# Patient Record
Sex: Female | Born: 1975 | Hispanic: No | Marital: Married | Smoking: Never smoker
Health system: Southern US, Community
[De-identification: ages and names within clinical notes are randomized; demographics above are authoritative.]

## PROBLEM LIST (undated history)

## (undated) DIAGNOSIS — D259 Leiomyoma of uterus, unspecified: Secondary | ICD-10-CM

## (undated) DIAGNOSIS — R55 Syncope and collapse: Principal | ICD-10-CM

## (undated) DIAGNOSIS — D573 Sickle-cell trait: Secondary | ICD-10-CM

## (undated) HISTORY — PX: WISDOM TOOTH EXTRACTION: SHX21

## (undated) HISTORY — DX: Sickle-cell trait: D57.3

## (undated) HISTORY — DX: Syncope and collapse: R55

## (undated) HISTORY — DX: Leiomyoma of uterus, unspecified: D25.9

---

## 2006-11-27 ENCOUNTER — Encounter: Admission: RE | Admit: 2006-11-27 | Discharge: 2006-11-27 | Payer: Self-pay | Admitting: Internal Medicine

## 2009-05-24 ENCOUNTER — Ambulatory Visit (HOSPITAL_COMMUNITY): Admission: RE | Admit: 2009-05-24 | Discharge: 2009-05-24 | Payer: Self-pay | Admitting: Obstetrics and Gynecology

## 2009-10-02 ENCOUNTER — Inpatient Hospital Stay (HOSPITAL_COMMUNITY): Admission: AD | Admit: 2009-10-02 | Discharge: 2009-10-02 | Payer: Self-pay | Admitting: Obstetrics and Gynecology

## 2009-10-03 ENCOUNTER — Inpatient Hospital Stay (HOSPITAL_COMMUNITY): Admission: AD | Admit: 2009-10-03 | Discharge: 2009-10-05 | Payer: Self-pay | Admitting: Obstetrics and Gynecology

## 2009-10-03 ENCOUNTER — Encounter (INDEPENDENT_AMBULATORY_CARE_PROVIDER_SITE_OTHER): Payer: Self-pay | Admitting: Obstetrics and Gynecology

## 2010-02-17 ENCOUNTER — Encounter: Payer: Self-pay | Admitting: Obstetrics and Gynecology

## 2010-04-11 LAB — CBC
HCT: 38.5 % (ref 36.0–46.0)
Hemoglobin: 12.6 g/dL (ref 12.0–15.0)
MCH: 28 pg (ref 26.0–34.0)
MCHC: 32.9 g/dL (ref 30.0–36.0)
MCHC: 34 g/dL (ref 30.0–36.0)
MCV: 85.1 fL (ref 78.0–100.0)
Platelets: 127 10*3/uL — ABNORMAL LOW (ref 150–400)
RDW: 16.6 % — ABNORMAL HIGH (ref 11.5–15.5)
WBC: 14.5 10*3/uL — ABNORMAL HIGH (ref 4.0–10.5)

## 2010-04-11 LAB — RPR: RPR Ser Ql: NONREACTIVE

## 2012-07-18 ENCOUNTER — Encounter (HOSPITAL_COMMUNITY): Payer: Self-pay | Admitting: *Deleted

## 2012-07-18 ENCOUNTER — Emergency Department (HOSPITAL_COMMUNITY)
Admission: EM | Admit: 2012-07-18 | Discharge: 2012-07-18 | Disposition: A | Discharge status: Home / Self Care | Payer: Managed Care, Other (non HMO) | Source: Home / Self Care

## 2012-07-18 DIAGNOSIS — J309 Allergic rhinitis, unspecified: Secondary | ICD-10-CM

## 2012-07-18 MED ORDER — FEXOFENADINE-PSEUDOEPHED ER 60-120 MG PO TB12: 1.0000 | ORAL_TABLET | Freq: Two times a day (BID) | ORAL | Status: DC

## 2012-07-18 MED ORDER — HYDROCOD POLST-CHLORPHEN POLST 10-8 MG/5ML PO LQCR: 5.0000 mL | Freq: Two times a day (BID) | ORAL | Status: DC | PRN

## 2012-07-18 NOTE — ED Provider Notes (Signed)
Medical screening examination/treatment/procedure(s) were performed by resident physician or non-physician practitioner and as supervising physician I was immediately available for consultation/collaboration.   Harald Quevedo DOUGLAS MD.   Misti Towle D Taneya Conkel, MD 07/18/12 1746 

## 2012-07-18 NOTE — ED Provider Notes (Signed)
History     CSN: [id number]  Arrival date & time 07/18/12  1110   None     Chief Complaint  Patient presents with  . Cough    (Consider location/radiation/quality/duration/timing/severity/associated sxs/prior treatment) HPI  37yo bf presents today with hx of cough, postnasal drainage, runny nose since Monday.  Had a sore throat at onset of symptoms but this now resolved.  Cough is nonproductive.  Hx of seasonal allergies.  Denies fever, chills, headache, sore throat, sinus pressure, dysphagia, cp, sob.    History reviewed. No pertinent past medical history.  History reviewed. No pertinent past surgical history.  No family history on file.  History  Substance Use Topics  . Smoking status: Never Smoker   . Smokeless tobacco: Not on file  . Alcohol Use: No    OB History   Grav Para Term Preterm Abortions TAB SAB Ect Mult Living                  Review of Systems  Constitutional: Negative.   HENT: Positive for congestion (intermittent), rhinorrhea and postnasal drip.   Eyes: Negative.   Respiratory: Positive for cough. Negative for apnea, chest tightness, shortness of breath and wheezing.   Cardiovascular: Negative.   Gastrointestinal: Negative.   Endocrine: Negative.   Genitourinary: Negative.   Musculoskeletal: Negative.   Skin: Negative.   Allergic/Immunologic: Positive for environmental allergies.  Neurological: Negative.   Psychiatric/Behavioral: Negative.     Allergies  Review of patient's allergies indicates no known allergies.  Home Medications   Current Outpatient Rx  Name  Route  Sig  Dispense  Refill  . UNKNOWN TO PATIENT      BCPs         . chlorpheniramine-HYDROcodone (TUSSIONEX PENNKINETIC ER) 10-8 MG/5ML LQCR   Oral   Take 5 mLs by mouth every 12 (twelve) hours as needed.   115 mL   0   . fexofenadine-pseudoephedrine (ALLEGRA-D) 60-120 MG per tablet   Oral   Take 1 tablet by mouth every 12 (twelve) hours.   30 tablet   0     BP  101/65  Pulse 92  Temp(Src) 98.1 F (36.7 C) (Oral)  Resp 17  SpO2 100%  LMP 07/03/2012  Physical Exam  Constitutional: She is oriented to person, place, and time. She appears well-developed and well-nourished.  HENT:  Head: Normocephalic and atraumatic.  Nose: Nose normal.  Mouth/Throat: Oropharynx is clear and moist. No oropharyngeal exudate.  Eyes: Conjunctivae and EOM are normal. Pupils are equal, round, and reactive to light.  Neck: Normal range of motion. Neck supple.  Cardiovascular: Normal rate, regular rhythm and normal heart sounds.   Pulmonary/Chest: Effort normal and breath sounds normal.  Abdominal: Soft. Bowel sounds are normal.  Musculoskeletal: Normal range of motion.  Lymphadenopathy:    She has no cervical adenopathy.  Neurological: She is alert and oriented to person, place, and time.  Skin: Skin is warm and dry.  Psychiatric: She has a normal mood and affect.    ED Course  Procedures (including critical care time)  Labs Reviewed - No data to display No results found.   1. Allergic rhinitis        cough    MDM  Patient will f/u in clinic 4-5 days if symptoms not improved or worsen.  treament plan discussed and she voices understanding.     Meds ordered this encounter  Medications  . UNKNOWN TO PATIENT    Sig: BCPs  . fexofenadine-pseudoephedrine (  ALLEGRA-D) 60-120 MG per tablet    Sig: Take 1 tablet by mouth every 12 (twelve) hours.    Dispense:  30 tablet    Refill:  0  . chlorpheniramine-HYDROcodone (TUSSIONEX PENNKINETIC ER) 10-8 MG/5ML LQCR    Sig: Take 5 mLs by mouth every 12 (twelve) hours as needed.    Dispense:  115 mL    Refill:  0          Zonia Kief, PA-C 07/18/12 1212

## 2012-07-18 NOTE — ED Notes (Signed)
Started Monday with post-nasal drip, on Tues had sore throat, by Wed had non-productive constant cough.  Continues with constant, nagging cough.  Denies any fevers or nasal congestion.  States sore throat better.  Has been taking Mucinex and OTC cough meds without relief.

## 2012-10-19 ENCOUNTER — Emergency Department (HOSPITAL_COMMUNITY)
Admission: EM | Admit: 2012-10-19 | Discharge: 2012-10-20 | Disposition: A | Discharge status: Home / Self Care | Payer: Managed Care, Other (non HMO) | Attending: Emergency Medicine | Admitting: Emergency Medicine

## 2012-10-19 ENCOUNTER — Encounter (HOSPITAL_COMMUNITY): Payer: Self-pay | Admitting: Emergency Medicine

## 2012-10-19 DIAGNOSIS — R42 Dizziness and giddiness: Secondary | ICD-10-CM | POA: Insufficient documentation

## 2012-10-19 DIAGNOSIS — R5381 Other malaise: Secondary | ICD-10-CM | POA: Insufficient documentation

## 2012-10-19 DIAGNOSIS — R55 Syncope and collapse: Secondary | ICD-10-CM | POA: Insufficient documentation

## 2012-10-19 DIAGNOSIS — Z79899 Other long term (current) drug therapy: Secondary | ICD-10-CM | POA: Insufficient documentation

## 2012-10-19 DIAGNOSIS — M25519 Pain in unspecified shoulder: Secondary | ICD-10-CM | POA: Insufficient documentation

## 2012-10-19 NOTE — ED Notes (Signed)
Patient here for R shoulder and neck pain, accompanied with dizziness and a sinus headache. Pt states she passed out at work and then went to [employer] where she received fluids, had lab work and had vitals assessed and patient states everything came back negative.

## 2012-10-19 NOTE — ED Notes (Addendum)
Pt. stated that she " passed out " at work this morning seen at [facility] ER ,CTscan / blood test /EKG / urine trest done during her stay at ER with no abnormal findings , seen yesterday at [facility] urgent care diagnosed with allergic rhinitis , pt. reports persistent dizziness .

## 2012-10-19 NOTE — ED Provider Notes (Signed)
CSN: [id number]     Arrival date & time 10/19/12  2036 History   This chart was scribed for Felicie Morn, NP, working with Juliet Rude. Rubin Payor, MD, by Allene Dillon, ED Scribe. This patient was seen in room TR11C/TR11C and the patient's care was started at 10:23 PM.  Chief Complaint  Patient presents with  . Dizziness   Patient is a 37 y.o. female presenting with weakness. The history is provided by the patient. No language interpreter was used.  Weakness This is a new problem. The current episode started 6 to 12 hours ago. The problem occurs constantly. The problem has not changed since onset.Pertinent negatives include no chest pain, no abdominal pain, no headaches and no shortness of breath. The symptoms are aggravated by standing. Nothing relieves the symptoms. She has tried nothing for the symptoms.   HPI Comments: Cheryl Rice is a 37 y.o. female who presents to the Emergency Department complaining of a near syncopal episode earlier today in which she experienced dizziness and light headedness upon standing, and fell. The fall was witnrssed and she denies head injury or LOC pertaining to the fall. After this fall, pt went to [facility], physician found no abnormal findings. Pt states that upon waking this morning, she had an onset of right shoulder pain, for which she took 2 Midol PM pills, containing 50 mg of Benadryl combined. She admits that this may have caused her dizziness today.  Pt plans to follow up with her PCP. Pt denies nausea, vomiting, chest pain, SOB, neck pain, headache, fever, chills, or any other symptoms.  PCP- Michel Santee Eyk   History reviewed. No pertinent past medical history. History reviewed. No pertinent past surgical history. No family history on file. History  Substance Use Topics  . Smoking status: Never Smoker   . Smokeless tobacco: Not on file  . Alcohol Use: No   OB History   Grav Para Term Preterm Abortions TAB SAB Ect Mult Living                  Review of Systems  Constitutional: Negative for chills.  HENT: Negative for neck pain.   Respiratory: Negative for shortness of breath.   Cardiovascular: Negative for chest pain.  Gastrointestinal: Negative for abdominal pain.  Musculoskeletal: Positive for arthralgias (Right shoulder pain).  Neurological: Positive for dizziness, weakness and light-headedness. Negative for syncope and headaches.  All other systems reviewed and are negative.    Allergies  Review of patient's allergies indicates no known allergies.  Home Medications   Current Outpatient Rx  Name  Route  Sig  Dispense  Refill  . Ibuprofen-Diphenhydramine Cit (MOTRIN PM PO)   Oral   Take 2 tablets by mouth every 6 (six) hours as needed.         . norethindrone-ethinyl estradiol (OVCON-50) 1-50 MG-MCG tablet   Oral   Take 1 tablet by mouth daily.          Triage Vitals: BP 98/67  Pulse 80  Temp(Src) 97.3 F (36.3 C) (Oral)  Resp 17  SpO2 99%  LMP 10/15/2012  Physical Exam  Nursing note and vitals reviewed. Constitutional: She is oriented to person, place, and time. She appears well-developed and well-nourished.  HENT:  Head: Normocephalic.  Eyes: EOM are normal. Pupils are equal, round, and reactive to light. Left eye exhibits no nystagmus.  Neck: Normal range of motion.  Cardiovascular: Normal rate and regular rhythm.   Pulmonary/Chest: Effort normal and breath sounds normal.  Abdominal: Soft. Bowel sounds are normal. She exhibits no distension.  Musculoskeletal: Normal range of motion. She exhibits no edema and no tenderness.  Neurological: She is alert and oriented to person, place, and time.  Normal neurological exam.   Skin: Skin is warm and dry. No rash noted.  Psychiatric: She has a normal mood and affect.    ED Course  Procedures (including critical care time)  DIAGNOSTIC STUDIES: Oxygen Saturation is 99% on RA, normal by my interpretation.    COORDINATION OF CARE: 10:27 PM-  Pt advised of plan for treatment and pt agrees.  Labs Review Labs Reviewed - No data to display Imaging Review No results found. Patient evaluated at [facility] today after suffering a syncopal episode.  Records obtained and reviewed.  Testing included CT of head, chest xray, EKG, cardiac monitoring with cardiac markers, urinalysis, urine pregnancy, cbc, and cmet.  Received IV fluids and discharged home.  No significant abnormalities noted in review of findings from [place]. Patient [facility] after returning home from [place] with lingering dizziness. No return of syncope.  Neuro exam grossly normal, no neck pain, headache, vertigo, nystagmus.  No chest pain or shortness of breath.  Patient less anxious after discussing prior results in detail.  Patient will follow-up with her PCP in the next few days.  Return precautions discussed. MDM  Dizziness.  I personally performed the services described in this documentation, which was scribed in my presence. The recorded information has been reviewed and is accurate.    Jimmye Norman, NP 10/20/12 0121  Jimmye Norman, NP 10/20/12 [phone]

## 2012-10-19 NOTE — ED Notes (Signed)
Pt states she also had a CT done today that was negative.

## 2012-10-20 NOTE — ED Notes (Signed)
Pt discharged.Vital signs stable and GCS 15 

## 2012-10-21 NOTE — ED Provider Notes (Signed)
Medical screening examination/treatment/procedure(s) were performed by non-physician practitioner and as supervising physician I was immediately available for consultation/collaboration.  Jkwon Treptow R. Tyrea Froberg, MD 10/21/12 1612 

## 2012-11-04 ENCOUNTER — Encounter: Payer: Self-pay | Admitting: Neurology

## 2012-11-05 ENCOUNTER — Encounter: Payer: Self-pay | Admitting: Neurology

## 2012-11-05 ENCOUNTER — Ambulatory Visit (INDEPENDENT_AMBULATORY_CARE_PROVIDER_SITE_OTHER): Payer: Managed Care, Other (non HMO) | Admitting: Neurology

## 2012-11-05 VITALS — BP 99/67 | HR 94 | Ht 66.5 in | Wt 142.0 lb

## 2012-11-05 DIAGNOSIS — R55 Syncope and collapse: Secondary | ICD-10-CM

## 2012-11-05 HISTORY — DX: Syncope and collapse: R55

## 2012-11-05 NOTE — Progress Notes (Signed)
Reason for visit: Syncope  Cheryl Rice is a 37 y.o. female  History of present illness:  Cheryl Rice is a 37 year old right-handed black female history of an event of syncope that occurred about 10 days ago. The patient was at work at that time, and she began noting that she was having difficulty focusing, and her ability to speak was slowing down. The patient had taken a Motrin p.m. tablet previously. The patient got up to go to the bathroom, and she felt lightheaded and slightly staggery. In the bathroom, she was talking with a coworker, and she began to feel increasingly dizzy, and she had to lean up against the wall. The patient then noted that the vision dimmed out, and she started sliding down the wall. Her coworker was able to catch her, and the patient did not have any jerking or stiffening or tongue biting or loss of bowel or bladder control. The patient had a period of unconsciousness lasting about 25-30 seconds. The patient denied any diaphoresis, but she did have some slight nausea after the event. The patient denied any abdominal cramping, or a sensation of needing to have a bowel movement. The patient denied any chest pain, palpitations of the heart, or shortness of breath. The patient went to the emergency room at [facility], and she indicates that a CT scan of brain was done, blood work was done, urine studies were done, and an EKG was done. The patient was told of these studies were unremarkable. I do not have the results of these studies. The patient was set up for an EEG study, but this has not been done. The patient denies any focal numbness or weakness of the face, arms, or legs. The patient denies episodes of syncope previously with exception of a faint that occurred at age 28 when she got overheated. The patient is sent to this office for an evaluation. There is no family history of syncope or seizures.  Past Medical History  Diagnosis Date  . Syncope and collapse 11/05/2012     History reviewed. No pertinent past surgical history.  Family History  Problem Relation Age of Onset  . Diabetes Mother   . Prostate cancer Father   . Glaucoma Father   . Hypertension Father     Social history:  reports that she has never smoked. She has never used smokeless tobacco. She reports that she does not drink alcohol or use illicit drugs.  Medications:  No current outpatient prescriptions on file prior to visit.   No current facility-administered medications on file prior to visit.     No Known Allergies  ROS:  Out of a complete 14 system review of symptoms, the patient complains only of the following symptoms, and all other reviewed systems are negative.  Passing out  Blood pressure 99/67, pulse 94, height 5' 6.5" (1.689 m), weight 142 lb (64.411 kg), last menstrual period 10/15/2012.  Physical Exam  General: The patient is alert and cooperative at the time of the examination.  Head: Pupils are equal, round, and reactive to light. Discs are flat bilaterally.  Neck: The neck is supple, no carotid bruits are noted.  Respiratory: The respiratory examination is clear.  Cardiovascular: The cardiovascular examination reveals a regular rate and rhythm, no obvious murmurs or rubs are noted.  Skin: Extremities are without significant edema.  Neurologic Exam  Mental status:  Cranial nerves: Facial symmetry is present. There is good sensation of the face to pinprick and soft touch bilaterally. The  strength of the facial muscles and the muscles to head turning and shoulder shrug are normal bilaterally. Speech is well enunciated, no aphasia or dysarthria is noted. Extraocular movements are full. Visual fields are full.  Motor: The motor testing reveals 5 over 5 strength of all 4 extremities. Good symmetric motor tone is noted throughout.  Sensory: Sensory testing is intact to pinprick, soft touch, vibration sensation, and position sense on all 4 extremities. No  evidence of extinction is noted.  Coordination: Cerebellar testing reveals good finger-nose-finger and heel-to-shin bilaterally.  Gait and station: Gait is normal. Tandem gait is normal. Romberg is negative. No drift is seen.  Reflexes: Deep tendon reflexes are symmetric and normal bilaterally. Toes are downgoing bilaterally.   Assessment/Plan:  1. Syncope, simple faint  The patient likely had a vasovagal event, not a seizure episode. The patient will undergo an EEG study. If this is unremarkable, I would not pursue any further workup. We will try to obtain the results of the CT scan done. The patient will followup through this office on an as-needed basis. The patient may return to driving once the EEG study is done, if it is normal.  C. Lesia Sago MD 11/05/2012 1:07 PM  [facility] [address] [place], [place] [address]  Phone [phone] Fax [phone]

## 2012-11-26 ENCOUNTER — Telehealth: Payer: Self-pay | Admitting: Neurology

## 2012-11-26 NOTE — Telephone Encounter (Signed)
Called patient to schedule and EEG appointment  With Dr Anne Hahn patient stated that she was doing ok, and did not need to do the test.

## 2013-04-22 ENCOUNTER — Encounter: Payer: Self-pay | Admitting: Cardiology

## 2013-04-29 ENCOUNTER — Other Ambulatory Visit: Payer: Self-pay | Admitting: *Deleted

## 2013-04-29 DIAGNOSIS — R55 Syncope and collapse: Secondary | ICD-10-CM

## 2013-04-29 NOTE — Progress Notes (Signed)
24 hour holter order put in. Requested by Dr Barbie Haggis phone [phone] fax [phone]. Patient currently has consult with Dr Meda Coffee on 05/19/13

## 2013-05-02 ENCOUNTER — Encounter: Payer: Self-pay | Admitting: *Deleted

## 2013-05-02 ENCOUNTER — Encounter (INDEPENDENT_AMBULATORY_CARE_PROVIDER_SITE_OTHER): Payer: Managed Care, Other (non HMO)

## 2013-05-02 DIAGNOSIS — R55 Syncope and collapse: Secondary | ICD-10-CM

## 2013-05-02 NOTE — Progress Notes (Signed)
Patient ID: Cheryl Rice, female   DOB: 1975-05-24, 38 y.o.   MRN: [id number] E-Cardio 24 hour holter monitor applied to patient.

## 2013-05-19 ENCOUNTER — Ambulatory Visit: Payer: Managed Care, Other (non HMO) | Admitting: Cardiology

## 2013-05-24 ENCOUNTER — Ambulatory Visit: Payer: Managed Care, Other (non HMO) | Admitting: Cardiology

## 2013-05-25 ENCOUNTER — Ambulatory Visit: Payer: Managed Care, Other (non HMO) | Admitting: Cardiology

## 2013-06-06 ENCOUNTER — Encounter: Payer: Self-pay | Admitting: *Deleted

## 2013-06-09 ENCOUNTER — Ambulatory Visit (INDEPENDENT_AMBULATORY_CARE_PROVIDER_SITE_OTHER): Payer: Managed Care, Other (non HMO) | Admitting: Cardiology

## 2013-06-09 ENCOUNTER — Encounter: Payer: Self-pay | Admitting: Cardiology

## 2013-06-09 VITALS — BP 122/76 | HR 72 | Ht 66.5 in | Wt 146.0 lb

## 2013-06-09 DIAGNOSIS — R55 Syncope and collapse: Secondary | ICD-10-CM

## 2013-06-09 NOTE — Progress Notes (Signed)
Patient ID: Cheryl Rice, female   DOB: 1975/04/14, 38 y.o.   MRN: [id number]     Patient Name: Cheryl Rice Date of Encounter: 06/09/2013  Primary Care Provider:  Townsend Roger, MD Primary Cardiologist:  Dorothy Spark  Problem List   Past Medical History  Diagnosis Date  . Syncope and collapse 11/05/2012  . Sickle cell trait   . Fibroid uterus    Past Surgical History  Procedure Laterality Date  . 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 The patient is very healthy and has no prior medical history, specifically denies diabetes, hypertension, or high cholesterol. The patient in the last year experienced 2 syncopal episodes that were not accompanied by chest pain, palpitations, or shortness of breath. She describes that those episodes happened on a day at work when she was stressed out, felt all sudden very weak, walked to the bathroom and on the way out of the bathroom she felt like her legs gave up on her and went to the floor. Prior hitting the floor she said everything turned black but right after she fell on the ground she was fully conscious and aware of the situation. She never had involuntary bowel or bladder movement. She was seen by neurologist and a CT scan of her head was normal. She was referred for EEG but she never underwent. She is not involved in any sports but is completely asymptomatic with activities of daily living and denies any chest pain or exertional dyspnea. She denies palpitations. No family history of coronary artery disease or sudden cardiac death. She has one daughter who is 44-year-old.  Home Medications  Prior to Admission medications   Medication Sig Start Date End Date Taking? Authorizing Provider  levonorgestrel-ethinyl estradiol (ORSYTHIA) 0.1-20 MG-MCG tablet Take 1 tablet by mouth daily.   Yes Historical Provider,  MD    Family History  Family History  Problem Relation Age of Onset  . Diabetes Mother   . Prostate cancer Father   . Glaucoma Father   . Hypertension Father   . Hypertension Mother   . Alcohol abuse Father     as well as tobacco  . Diabetes Paternal Grandmother     on dialysis  . Diabetes Maternal Grandmother   . Diabetes Maternal Aunt     several mat aunts    Social History  History   Social History  . Marital Status: Married    Spouse Name: N/A    Number of Children: 1  . Years of Education: college   Occupational History  .     Social History Main Topics  . Smoking status: Never Smoker   . Smokeless tobacco: Never Used  . Alcohol Use: No  . Drug Use: No  . Sexual Activity: Not on file   Other Topics Concern  . Not on file   Social History Narrative  . No narrative on file     Review of Systems, as per HPI, otherwise negative General:  No chills, fever, night sweats or weight changes.  Cardiovascular:  No chest pain, dyspnea on exertion, edema, orthopnea, palpitations, paroxysmal nocturnal dyspnea. Dermatological: No rash, lesions/masses Respiratory: No cough, dyspnea Urologic: No hematuria, dysuria Abdominal:   No nausea, vomiting, diarrhea, bright red blood per rectum, melena, or hematemesis Neurologic:  No visual changes, wkns, changes in mental status.  All other systems reviewed and are otherwise negative except as noted above.  Physical Exam  Blood pressure 122/76, pulse 72, height 5' 6.5" (1.689 m), weight 146 lb (66.225 kg).  General: Pleasant, NAD Psych: Normal affect. Neuro: Alert and oriented X 3. Moves all extremities spontaneously. HEENT: Normal  Neck: Supple without bruits or JVD. Lungs:  Resp regular and unlabored, CTA. Heart: RRR no s3, s4, or murmurs. Abdomen: Soft, non-tender, non-distended, BS + x 4.  Extremities: No clubbing, cyanosis or edema. DP/PT/Radials 2+ and equal bilaterally.  Labs: Glucose 76 Creatinine  0.9 Sodium 143 Potassium 4.8 AST cough ALT 10 TSH 0.5 WBC 3.6 Hemoglobin 13.2   Accessory Clinical Findings  Echocardiogram - none  ECG - sinus rhythm, 70 beats per minute otherwise normal EKG    Assessment & Plan  A very pleasant and healthy 38 year old female who is coming after 2 syncopal episodes. Her EKG is completely normal and doesn't show any Brugada pattern or preexcitation. 24 hour Holter monitoring performed on 05/02/2013 was completely normal with no arrhythmias and only one single PVC in 24 hours. No pauses. We will order an echocardiogram to rule out any potential structural heart disease specifically looking at the right ventricular size and function throughout potential ARVC. And also to evaluate for left atrial size. This is most probably vasovagal syncope related to stress patient is advised to start exercising on regular basis and also monitor her hydration.  Followup as needed.    Dorothy Spark, MD, [facility] 06/09/2013, 3:42 PM

## 2013-06-09 NOTE — Patient Instructions (Addendum)
Your physician has requested that you have an echocardiogram. Echocardiography is a painless test that uses sound waves to create images of your heart. It provides your doctor with information about the size and shape of your heart and how well your heart's chambers and valves are working. This procedure takes approximately one hour. There are no restrictions for this procedure.  Your physician recommends that you schedule a follow-up appointment in: AS NEEDED WITH DR Meda Coffee  WE WILL CONTACT YOU WITH YOUR ECHO RESULTS

## 2013-07-19 ENCOUNTER — Other Ambulatory Visit (HOSPITAL_COMMUNITY): Payer: Managed Care, Other (non HMO)

## 2013-08-23 ENCOUNTER — Encounter (HOSPITAL_COMMUNITY): Payer: Self-pay | Admitting: Emergency Medicine

## 2013-08-23 DIAGNOSIS — R55 Syncope and collapse: Secondary | ICD-10-CM | POA: Insufficient documentation

## 2013-08-23 DIAGNOSIS — R404 Transient alteration of awareness: Secondary | ICD-10-CM | POA: Insufficient documentation

## 2013-08-23 DIAGNOSIS — R5383 Other fatigue: Principal | ICD-10-CM

## 2013-08-23 DIAGNOSIS — R5381 Other malaise: Secondary | ICD-10-CM | POA: Insufficient documentation

## 2013-08-23 DIAGNOSIS — Z8742 Personal history of other diseases of the female genital tract: Secondary | ICD-10-CM | POA: Insufficient documentation

## 2013-08-23 DIAGNOSIS — Z862 Personal history of diseases of the blood and blood-forming organs and certain disorders involving the immune mechanism: Secondary | ICD-10-CM | POA: Insufficient documentation

## 2013-08-23 DIAGNOSIS — Z3202 Encounter for pregnancy test, result negative: Secondary | ICD-10-CM | POA: Insufficient documentation

## 2013-08-23 DIAGNOSIS — Z79899 Other long term (current) drug therapy: Secondary | ICD-10-CM | POA: Insufficient documentation

## 2013-08-23 LAB — URINALYSIS, ROUTINE W REFLEX MICROSCOPIC
BILIRUBIN URINE: NEGATIVE
Glucose, UA: NEGATIVE mg/dL
Hgb urine dipstick: NEGATIVE
KETONES UR: NEGATIVE mg/dL
Nitrite: NEGATIVE
PROTEIN: NEGATIVE mg/dL
SPECIFIC GRAVITY, URINE: 1.007 (ref 1.005–1.030)
UROBILINOGEN UA: 0.2 mg/dL (ref 0.0–1.0)
pH: 7 (ref 5.0–8.0)

## 2013-08-23 LAB — CBC WITH DIFFERENTIAL/PLATELET
BASOS ABS: 0 10*3/uL (ref 0.0–0.1)
Basophils Relative: 0 % (ref 0–1)
EOS PCT: 2 % (ref 0–5)
Eosinophils Absolute: 0.2 10*3/uL (ref 0.0–0.7)
HCT: 39.2 % (ref 36.0–46.0)
HEMOGLOBIN: 13.5 g/dL (ref 12.0–15.0)
LYMPHS ABS: 2.5 10*3/uL (ref 0.7–4.0)
LYMPHS PCT: 36 % (ref 12–46)
MCH: 28.7 pg (ref 26.0–34.0)
MCHC: 34.4 g/dL (ref 30.0–36.0)
MCV: 83.2 fL (ref 78.0–100.0)
Monocytes Absolute: 0.4 10*3/uL (ref 0.1–1.0)
Monocytes Relative: 5 % (ref 3–12)
Neutro Abs: 3.9 10*3/uL (ref 1.7–7.7)
Neutrophils Relative %: 57 % (ref 43–77)
Platelets: 205 10*3/uL (ref 150–400)
RBC: 4.71 MIL/uL (ref 3.87–5.11)
RDW: 12.7 % (ref 11.5–15.5)
WBC: 7 10*3/uL (ref 4.0–10.5)

## 2013-08-23 LAB — COMPREHENSIVE METABOLIC PANEL
ALT: 11 U/L (ref 0–35)
AST: 17 U/L (ref 0–37)
Albumin: 3.7 g/dL (ref 3.5–5.2)
Alkaline Phosphatase: 57 U/L (ref 39–117)
Anion gap: 13 (ref 5–15)
BUN: 11 mg/dL (ref 6–23)
CHLORIDE: 103 meq/L (ref 96–112)
CO2: 21 mEq/L (ref 19–32)
CREATININE: 0.91 mg/dL (ref 0.50–1.10)
Calcium: 9.3 mg/dL (ref 8.4–10.5)
GFR, EST NON AFRICAN AMERICAN: 79 mL/min — AB (ref >90)
Glucose, Bld: 90 mg/dL (ref 70–99)
POTASSIUM: 4.3 meq/L (ref 3.7–5.3)
SODIUM: 137 meq/L (ref 137–147)
TOTAL PROTEIN: 7.6 g/dL (ref 6.0–8.3)
Total Bilirubin: 0.7 mg/dL (ref 0.3–1.2)

## 2013-08-23 LAB — URINE MICROSCOPIC-ADD ON

## 2013-08-23 NOTE — ED Notes (Addendum)
PT was shopping and had a hot flash and blacked out. Similar episodes in past. Got hot and very tired. Has neurologist and cardiologist and no known dx for these episodes. Reports been feeling very tired lately.

## 2013-08-24 ENCOUNTER — Emergency Department (HOSPITAL_COMMUNITY)
Admission: EM | Admit: 2013-08-24 | Discharge: 2013-08-24 | Disposition: A | Discharge status: Home / Self Care | Payer: Managed Care, Other (non HMO) | Attending: Emergency Medicine | Admitting: Emergency Medicine

## 2013-08-24 DIAGNOSIS — R5383 Other fatigue: Secondary | ICD-10-CM

## 2013-08-24 DIAGNOSIS — R55 Syncope and collapse: Secondary | ICD-10-CM

## 2013-08-24 LAB — PREGNANCY, URINE: Preg Test, Ur: NEGATIVE

## 2013-08-24 NOTE — ED Provider Notes (Signed)
CSN: [id number]     Arrival date & time 08/23/13  1931 History   First MD Initiated Contact with Patient 08/24/13 0047     Chief Complaint  Patient presents with  . Loss of Consciousness     (Consider location/radiation/quality/duration/timing/severity/associated sxs/prior Treatment) HPI 38 year old female presents to emergency department after an episode of syncope.  Patient has had several episodes of syncope in the past.  She has workup through neurology and cardiology without specific findings.  Per notes, does not be vasovagal.  Patient reports episodes of syncope or usually started with a flash of heat to her face, followed soon after by syncope.  Patient denies striking her head.  Patient reports she usually does not have time to sit down when these episodes occur.  Patient denies any recent illnesses, she reports that she is eating well.  No nausea vomiting or diarrhea.  Patient complains of extreme fatigue over the last week.  She reports that she has going to bed earlier, but not feeling rested when she wakes.  She has followup with her doctor next week for this fatigue.  Patient denies previous history of thyroid problems or B12 checks.  She reports she was told in the past that her vitamin D level was low.  Last measured.  Was last week.  No prior history of anemia.  Patient reports that her normal diet is a small breakfast, lunch and small dinner.  She reports today she had a pack of crackers and a soda for breakfast, she chicken nuggets for lunch, and since that time has been here in the hospital awaiting to be seen.  She reports she had another package of crackers and soda while waiting Past Medical History  Diagnosis Date  . Syncope and collapse 11/05/2012  . Sickle cell trait   . Fibroid uterus    Past Surgical History  Procedure Laterality Date  . Wisdom tooth extraction     Family History  Problem Relation Age of Onset  . Diabetes Mother   . Prostate cancer Father   .  Glaucoma Father   . Hypertension Father   . Hypertension Mother   . Alcohol abuse Father     as well as tobacco  . Diabetes Paternal Grandmother     on dialysis  . Diabetes Maternal Grandmother   . Diabetes Maternal Aunt     several mat aunts   History  Substance Use Topics  . Smoking status: Never Smoker   . Smokeless tobacco: Never Used  . Alcohol Use: No   OB History   Grav Para Term Preterm Abortions TAB SAB Ect Mult Living                 Review of Systems   See History of Present Illness; otherwise all other systems are reviewed and negative  Allergies  Review of patient's allergies indicates no known allergies.  Home Medications   Prior to Admission medications   Medication Sig Start Date End Date Taking? Authorizing Provider  levonorgestrel-ethinyl estradiol (ORSYTHIA) 0.1-20 MG-MCG tablet Take 1 tablet by mouth daily.   Yes Historical Provider, MD   BP 99/57  Pulse 65  Temp(Src) 97.4 F (36.3 C) (Oral)  Resp 18  SpO2 100%  LMP 08/18/2013 Physical Exam  Nursing note and vitals reviewed. Constitutional: She is oriented to person, place, and time. She appears well-developed and well-nourished.  HENT:  Head: Normocephalic and atraumatic.  Right Ear: External ear normal.  Left Ear: External ear normal.  Nose: Nose normal.  Mouth/Throat: Oropharynx is clear and moist.  Eyes: Conjunctivae and EOM are normal. Pupils are equal, round, and reactive to light.  Neck: Normal range of motion. Neck supple. No JVD present. No tracheal deviation present. No thyromegaly present.  Cardiovascular: Normal rate, regular rhythm, normal heart sounds and intact distal pulses.  Exam reveals no gallop and no friction rub.   No murmur heard. Pulmonary/Chest: Effort normal and breath sounds normal. No stridor. No respiratory distress. She has no wheezes. She has no rales. She exhibits no tenderness.  Abdominal: Soft. Bowel sounds are normal. She exhibits no distension and no mass.  There is no tenderness. There is no rebound and no guarding.  Musculoskeletal: Normal range of motion. She exhibits no edema and no tenderness.  Lymphadenopathy:    She has no cervical adenopathy.  Neurological: She is alert and oriented to person, place, and time. She has normal reflexes. No cranial nerve deficit. She exhibits normal muscle tone. Coordination normal.  Skin: Skin is warm and dry. No rash noted. No erythema. No pallor.  Psychiatric: She has a normal mood and affect. Her behavior is normal. Judgment and thought content normal.    ED Course  Procedures (including critical care time) Labs Review Labs Reviewed  COMPREHENSIVE METABOLIC PANEL - Abnormal; Notable for the following:    GFR calc non Af Amer 79 (*)    All other components within normal limits  URINALYSIS, ROUTINE W REFLEX MICROSCOPIC - Abnormal; Notable for the following:    Leukocytes, UA TRACE (*)    All other components within normal limits  URINE MICROSCOPIC-ADD ON - Abnormal; Notable for the following:    Squamous Epithelial / LPF FEW (*)    All other components within normal limits  CBC WITH DIFFERENTIAL  PREGNANCY, URINE    Imaging Review No results found.   EKG Interpretation None      Date: 08/24/2013  Rate: 57  Rhythm: normal sinus rhythm  QRS Axis: normal  Intervals: normal  ST/T Wave abnormalities: normal  Conduction Disutrbances:none  Narrative Interpretation:   Old EKG Reviewed: unchanged   MDM   Final diagnoses:  Other fatigue  Syncope and collapse    38 year old female with frequent episodes of syncope, a week of fatigue.  Patient advised to followup with her doctor for checks of thyroid and B12 level.  Patient recommended to try a low dose of melatonin to see if that will help her have more restful sleep at night.  Patient has artery had thorough workup in the past for her syncopal episodes.  No abnormalities noted on physical exam or lab workup.  Do not feel that she needs  further evaluation at this time.    Kalman Drape, MD 08/24/13 [phone]

## 2013-08-24 NOTE — ED Notes (Addendum)
Pt discharged home with all belongings, pt alert, oriented, and ambulatory upon discharge. No new RX prescribed, pt verbalizes understanding of discharge instructions, no narcotics given in ED. Pt escorted to exit by Cruzita Lederer, pt refused wheel chair

## 2013-08-24 NOTE — ED Notes (Signed)
Pt reports feeling "fatigued" x1 week, having a hot flash tonight causing her to black out. Pt reports similar episode in the past.

## 2013-08-24 NOTE — ED Notes (Signed)
MD Otter at bedside.

## 2013-08-24 NOTE — Discharge Instructions (Signed)
You may try over the counter melatonin (3 mg dosing) to see if this will help with more restful sleep.  Follow up with your doctor as scheduled for further testing for your fatigue.   Fatigue Fatigue is a feeling of tiredness, lack of energy, lack of motivation, or feeling tired all the time. Having enough rest, good nutrition, and reducing stress will normally reduce fatigue. Consult your caregiver if it persists. The nature of your fatigue will help your caregiver to find out its cause. The treatment is based on the cause.  CAUSES  There are many causes for fatigue. Most of the time, fatigue can be traced to one or more of your habits or routines. Most causes fit into one or more of three general areas. They are: Lifestyle problems  Sleep disturbances.  Overwork.  Physical exertion.  Unhealthy habits.  Poor eating habits or eating disorders.  Alcohol and/or drug use .  Lack of proper nutrition (malnutrition). Psychological problems  Stress and/or anxiety problems.  Depression.  Grief.  Boredom. Medical Problems or Conditions  Anemia.  Pregnancy.  Thyroid gland problems.  Recovery from major surgery.  Continuous pain.  Emphysema or asthma that is not well controlled  Allergic conditions.  Diabetes.  Infections (such as mononucleosis).  Obesity.  Sleep disorders, such as sleep apnea.  Heart failure or other heart-related problems.  Cancer.  Kidney disease.  Liver disease.  Effects of certain medicines such as antihistamines, cough and cold remedies, prescription pain medicines, heart and blood pressure medicines, drugs used for treatment of cancer, and some antidepressants. SYMPTOMS  The symptoms of fatigue include:   Lack of energy.  Lack of drive (motivation).  Drowsiness.  Feeling of indifference to the surroundings. DIAGNOSIS  The details of how you feel help guide your caregiver in finding out what is causing the fatigue. You will be  asked about your present and past health condition. It is important to review all medicines that you take, including prescription and non-prescription items. A thorough exam will be done. You will be questioned about your feelings, habits, and normal lifestyle. Your caregiver may suggest blood tests, urine tests, or other tests to look for common medical causes of fatigue.  TREATMENT  Fatigue is treated by correcting the underlying cause. For example, if you have continuous pain or depression, treating these causes will improve how you feel. Similarly, adjusting the dose of certain medicines will help in reducing fatigue.  HOME CARE INSTRUCTIONS   Try to get the required amount of good sleep every night.  Eat a healthy and nutritious diet, and drink enough water throughout the day.  Practice ways of relaxing (including yoga or meditation).  Exercise regularly.  Make plans to change situations that cause stress. Act on those plans so that stresses decrease over time. Keep your work and personal routine reasonable.  Avoid street drugs and minimize use of alcohol.  Start taking a daily multivitamin after consulting your caregiver. SEEK MEDICAL CARE IF:   You have persistent tiredness, which cannot be accounted for.  You have fever.  You have unintentional weight loss.  You have headaches.  You have disturbed sleep throughout the night.  You are feeling sad.  You have constipation.  You have dry skin.  You have gained weight.  You are taking any new or different medicines that you suspect are causing fatigue.  You are unable to sleep at night.  You develop any unusual swelling of your legs or other parts of your  body. SEEK IMMEDIATE MEDICAL CARE IF:   You are feeling confused.  Your vision is blurred.  You feel faint or pass out.  You develop severe headache.  You develop severe abdominal, pelvic, or back pain.  You develop chest pain, shortness of breath, or an  irregular or fast heartbeat.  You are unable to pass a normal amount of urine.  You develop abnormal bleeding such as bleeding from the rectum or you vomit blood.  You have thoughts about harming yourself or committing suicide.  You are worried that you might harm someone else. MAKE SURE YOU:   Understand these instructions.  Will watch your condition.  Will get help right away if you are not doing well or get worse. Document Released: 11/10/2006 Document Revised: 04/07/2011 Document Reviewed: 05/17/2013 [facility] Patient Information 2015 [place], [place]. This information is not intended to replace advice given to you by your health care provider. Make sure you discuss any questions you have with your health care provider.  Vasovagal Syncope, Adult Syncope, commonly known as fainting, is a temporary loss of consciousness. It occurs when the blood flow to the brain is reduced. Vasovagal syncope (also called neurocardiogenic syncope) is a fainting spell in which the blood flow to the brain is reduced because of a sudden drop in heart rate and blood pressure. Vasovagal syncope occurs when the brain and the cardiovascular system (blood vessels) do not adequately communicate and respond to each other. This is the most common cause of fainting. It often occurs in response to fear or some other type of emotional or physical stress. The body has a reaction in which the heart starts beating too slowly or the blood vessels expand, reducing blood pressure. This type of fainting spell is generally considered harmless. However, injuries can occur if a person takes a sudden fall during a fainting spell.  CAUSES  Vasovagal syncope occurs when a person's blood pressure and heart rate decrease suddenly, usually in response to a trigger. Many things and situations can trigger an episode. Some of these include:   Pain.   Fear.   The sight of blood or medical procedures, such as blood being drawn from a  vein.   Common activities, such as coughing, swallowing, stretching, or going to the bathroom.   Emotional stress.   Prolonged standing, especially in a warm environment.   Lack of sleep or rest.   Prolonged lack of food.   Prolonged lack of fluids.   Recent illness.  The use of certain drugs that affect blood pressure, such as cocaine, alcohol, marijuana, inhalants, and opiates.  SYMPTOMS  Before the fainting episode, you may:   Feel dizzy or light headed.   Become pale.  Sense that you are going to faint.   Feel like the room is spinning.   Have tunnel vision, only seeing directly in front of you.   Feel sick to your stomach (nauseous).   See spots or slowly lose vision.   Hear ringing in your ears.   Have a headache.   Feel warm and sweaty.   Feel a sensation of pins and needles. During the fainting spell, you will generally be unconscious for no longer than a couple minutes before waking up and returning to normal. If you get up too quickly before your body can recover, you may faint again. Some twitching or jerky movements may occur during the fainting spell.  DIAGNOSIS  Your caregiver will ask about your symptoms, take a medical history, and perform  a physical exam. Various tests may be done to rule out other causes of fainting. These may include blood tests and tests to check the heart, such as electrocardiography, echocardiography, and possibly an electrophysiology study. When other causes have been ruled out, a test may be done to check the body's response to changes in position (tilt table test). TREATMENT  Most cases of vasovagal syncope do not require treatment. Your caregiver may recommend ways to avoid fainting triggers and may provide home strategies for preventing fainting. If you must be exposed to a possible trigger, you can drink additional fluids to help reduce your chances of having an episode of vasovagal syncope. If you have warning  signs of an oncoming episode, you can respond by positioning yourself favorably (lying down). If your fainting spells continue, you may be given medicines to prevent fainting. Some medicines may help make you more resistant to repeated episodes of vasovagal syncope. Special exercises or compression stockings may be recommended. In rare cases, the surgical placement of a pacemaker is considered. HOME CARE INSTRUCTIONS   Learn to identify the warning signs of vasovagal syncope.   Sit or lie down at the first warning sign of a fainting spell. If sitting, put your head down between your legs. If you lie down, swing your legs up in the air to increase blood flow to the brain.   Avoid hot tubs and saunas.  Avoid prolonged standing.  Drink enough fluids to keep your urine clear or pale yellow. Avoid caffeine.  Increase salt in your diet as directed by your caregiver.   If you have to stand for a long time, perform movements such as:   Crossing your legs.   Flexing and stretching your leg muscles.   Squatting.   Moving your legs.   Bending over.   Only take over-the-counter or prescription medicines as directed by your caregiver. Do not suddenly stop any medicines without asking your caregiver first. SEEK MEDICAL CARE IF:   Your fainting spells continue or happen more frequently in spite of treatment.   You lose consciousness for more than a couple minutes.  You have fainting spells during or after exercising or after being startled.   You have new symptoms that occur with the fainting spells, such as:   Shortness of breath.  Chest pain.   Irregular heartbeat.   You have episodes of twitching or jerky movements that last longer than a few seconds.  You have episodes of twitching or jerky movements without obvious fainting. SEEK IMMEDIATE MEDICAL CARE IF:   You have injuries or bleeding after a fainting spell.   You have episodes of twitching or jerky  movements that last longer than 5 minutes.   You have more than one spell of twitching or jerky movements before returning to consciousness after fainting. MAKE SURE YOU:   Understand these instructions.  Will watch your condition.  Will get help right away if you are not doing well or get worse. Document Released: 12/31/2011 Document Reviewed: 12/31/2011 [facility] Patient Information 2015 [place]. This information is not intended to replace advice given to you by your health care provider. Make sure you discuss any questions you have with your health care provider.

## 2014-01-30 ENCOUNTER — Encounter (HOSPITAL_COMMUNITY): Payer: Self-pay | Admitting: *Deleted

## 2014-01-30 ENCOUNTER — Emergency Department (HOSPITAL_COMMUNITY)
Admission: EM | Admit: 2014-01-30 | Discharge: 2014-01-30 | Disposition: A | Discharge status: Home / Self Care | Payer: Managed Care, Other (non HMO) | Source: Home / Self Care | Attending: Emergency Medicine | Admitting: Emergency Medicine

## 2014-01-30 DIAGNOSIS — R0781 Pleurodynia: Secondary | ICD-10-CM

## 2014-01-30 MED ORDER — IBUPROFEN 600 MG PO TABS: 600.0000 mg | ORAL_TABLET | Freq: Three times a day (TID) | ORAL | Status: DC

## 2014-01-30 MED ORDER — IBUPROFEN 800 MG PO TABS
ORAL_TABLET | ORAL | Status: AC
  Filled 2014-01-30: qty 1

## 2014-01-30 MED ORDER — HYDROCODONE-ACETAMINOPHEN 5-325 MG PO TABS: 1.0000 | ORAL_TABLET | ORAL | Status: DC | PRN

## 2014-01-30 MED ORDER — IBUPROFEN 800 MG PO TABS
800.0000 mg | ORAL_TABLET | Freq: Once | ORAL | Status: AC
  Administered 2014-01-30: 800 mg via ORAL

## 2014-01-30 NOTE — ED Provider Notes (Signed)
CSN: [id number]     Arrival date & time 01/30/14  7824 History   First MD Initiated Contact with Patient 01/30/14 [phone]     Chief Complaint  Patient presents with  . Back Pain   (Consider location/radiation/quality/duration/timing/severity/associated sxs/prior Treatment) Patient is a 39 y.o. female presenting with back pain. The history is provided by the patient.  Back Pain   She is a 39 year old woman here for evaluation of left rib pain. This started on Saturday night when she states she moved wrong in bed. It is located in the left lateral ribs and described as sharp. It is worse with coughing or sneezing and certain movements. The pain will radiate up and down. No shortness of breath or chest pain. No numbness, tingling, weakness.  Past Medical History  Diagnosis Date  . Syncope and collapse 11/05/2012  . Sickle cell trait   . Fibroid uterus    Past Surgical History  Procedure Laterality Date  . Wisdom tooth extraction     Family History  Problem Relation Age of Onset  . Diabetes Mother   . Prostate cancer Father   . Glaucoma Father   . Hypertension Father   . Hypertension Mother   . Alcohol abuse Father     as well as tobacco  . Diabetes Paternal Grandmother     on dialysis  . Diabetes Maternal Grandmother   . Diabetes Maternal Aunt     several mat aunts   History  Substance Use Topics  . Smoking status: Never Smoker   . Smokeless tobacco: Never Used  . Alcohol Use: No   OB History    No data available     Review of Systems  Musculoskeletal: Positive for back pain.   as in history of present illness  Allergies  Review of patient's allergies indicates no known allergies.  Home Medications   Prior to Admission medications   Medication Sig Start Date End Date Taking? Authorizing Provider  HYDROcodone-acetaminophen (NORCO) 5-325 MG per tablet Take 1 tablet by mouth every 4 (four) hours as needed for moderate pain. 01/30/14   Melony Overly, MD  ibuprofen  (ADVIL,MOTRIN) 600 MG tablet Take 1 tablet (600 mg total) by mouth 3 (three) times daily. 01/30/14   Melony Overly, MD  levonorgestrel-ethinyl estradiol (ORSYTHIA) 0.1-20 MG-MCG tablet Take 1 tablet by mouth daily.    Historical Provider, MD   BP 117/67 mmHg  Pulse 73  Temp(Src) 98.1 F (36.7 C) (Oral)  Resp 16  SpO2 99%  LMP 01/30/2014 (LMP Unknown) Physical Exam  Constitutional: She is oriented to person, place, and time. She appears well-developed and well-nourished. She appears distressed (looks uncomfortable).  Cardiovascular: Normal rate.   Pulmonary/Chest: Effort normal and breath sounds normal. No respiratory distress. She has no wheezes. She has no rales.    Neurological: She is alert and oriented to person, place, and time.    ED Course  Procedures (including critical care time) Labs Review Labs Reviewed - No data to display  Imaging Review No results found.   MDM   1. Rib pain on left side    I suspect she has a broken rib. After discussion with the patient, it was decided to not obtain a rib x-ray at this time. Her history does not indicate any sort of injury or trauma that would cause a broken rib. Symptomatic treatment with ibuprofen and Norco. Will give ibuprofen 800 mg by mouth prior to discharge. Reviewed reasons to return including worsening of pain  and any difficulty breathing.   Melony Overly, MD 01/30/14 [phone]

## 2014-01-30 NOTE — Discharge Instructions (Signed)
I think you have a broken rib. Take ibuprofen 600mg  3 times a day for the next week. Use the Norco every 4-6 hours as needed for severe pain. Apply ice 2-3 times a day for the next 2 days.  After that, you can alternate heat and ice. Make sure you take deep breaths several times a day.  If you develop worsening pain or trouble breathing, please come back.

## 2014-01-30 NOTE — ED Notes (Signed)
Pt  Reports  l  Sided   Back  Pain   Worse  When  She  Moves       - symptoms   X     2  Days         denys  Any  specefic  Injury     -     Pt      Ambulated  To  Room  With a  Slow  Steady  Gait

## 2014-05-15 ENCOUNTER — Encounter (HOSPITAL_COMMUNITY): Payer: Self-pay | Admitting: Emergency Medicine

## 2014-05-15 ENCOUNTER — Emergency Department (HOSPITAL_COMMUNITY)
Admission: EM | Admit: 2014-05-15 | Discharge: 2014-05-15 | Disposition: A | Discharge status: Home / Self Care | Payer: Managed Care, Other (non HMO) | Source: Home / Self Care | Attending: Emergency Medicine | Admitting: Emergency Medicine

## 2014-05-15 DIAGNOSIS — J209 Acute bronchitis, unspecified: Secondary | ICD-10-CM

## 2014-05-15 DIAGNOSIS — J302 Other seasonal allergic rhinitis: Secondary | ICD-10-CM

## 2014-05-15 MED ORDER — PREDNISONE 50 MG PO TABS: ORAL_TABLET | ORAL | Status: DC

## 2014-05-15 MED ORDER — AZITHROMYCIN 250 MG PO TABS: ORAL_TABLET | ORAL | Status: DC

## 2014-05-15 NOTE — ED Provider Notes (Signed)
CSN: [id number]     Arrival date & time 05/15/14  1805 History   First MD Initiated Contact with Patient 05/15/14 1940     Chief Complaint  Patient presents with  . Cough   (Consider location/radiation/quality/duration/timing/severity/associated sxs/prior Treatment) HPI She is a 39 year old woman here for evaluation of cough. She states her symptoms started 2-3 days ago with cough, chest tightness, wheezing. She does report some nasal and chest congestion. No sore throat. No fevers. She has been taking Zyrtec and Sudafed with some improvement.  Past Medical History  Diagnosis Date  . Syncope and collapse 11/05/2012  . Sickle cell trait   . Fibroid uterus    Past Surgical History  Procedure Laterality Date  . Wisdom tooth extraction     Family History  Problem Relation Age of Onset  . Diabetes Mother   . Prostate cancer Father   . Glaucoma Father   . Hypertension Father   . Hypertension Mother   . Alcohol abuse Father     as well as tobacco  . Diabetes Paternal Grandmother     on dialysis  . Diabetes Maternal Grandmother   . Diabetes Maternal Aunt     several mat aunts   History  Substance Use Topics  . Smoking status: Never Smoker   . Smokeless tobacco: Never Used  . Alcohol Use: No   OB History    No data available     Review of Systems  Constitutional: Negative for fever.  HENT: Positive for congestion. Negative for rhinorrhea and sore throat.   Respiratory: Positive for cough, chest tightness and wheezing. Negative for shortness of breath.   Cardiovascular: Negative for chest pain.    Allergies  Review of patient's allergies indicates no known allergies.  Home Medications   Prior to Admission medications   Medication Sig Start Date End Date Taking? Authorizing Provider  azithromycin (ZITHROMAX Z-PAK) 250 MG tablet Take 2 pills today, then 1 pill daily until gone. 05/15/14   Melony Overly, MD  levonorgestrel-ethinyl estradiol (ORSYTHIA) 0.1-20 MG-MCG  tablet Take 1 tablet by mouth daily.    Historical Provider, MD  predniSONE (DELTASONE) 50 MG tablet Take 1 pill daily for 5 days. 05/15/14   Melony Overly, MD   BP 128/65 mmHg  Pulse 99  Temp(Src) 98.8 F (37.1 C) (Oral)  Resp 16  SpO2 98% Physical Exam  Constitutional: She is oriented to person, place, and time. She appears well-developed and well-nourished. No distress.  HENT:  Mouth/Throat: No oropharyngeal exudate.  Nasal mucosa slightly erythematous. Oropharynx with mild erythema and mild cobblestoning.  Neck: Neck supple.  Cardiovascular: Normal rate, regular rhythm and normal heart sounds.   No murmur heard. Pulmonary/Chest: Effort normal and breath sounds normal. No respiratory distress. She has no wheezes. She has no rales.  Lymphadenopathy:    She has no cervical adenopathy.  Neurological: She is alert and oriented to person, place, and time.    ED Course  Procedures (including critical care time) Labs Review Labs Reviewed - No data to display  Imaging Review No results found.   MDM   1. Acute bronchitis, unspecified organism   2. Seasonal allergies    We'll treat with azithromycin and prednisone. Recommended trying Claritin or Allegra for allergies as she states Zyrtec dries her out. Follow-up as needed.    Melony Overly, MD 05/15/14 2011

## 2014-05-15 NOTE — ED Notes (Signed)
Pt states that she has had a cough since 05/13/2014

## 2014-05-15 NOTE — Discharge Instructions (Signed)
You have bronchitis as well as some allergies. Take prednisone and azithromycin as prescribed. It is okay to use over-the-counter cough syrup or honey for the coughing. Take an allergy pill daily. If Zyrtec dries you out too much, you can try Claritin or Allegra. Follow-up as needed.

## 2014-11-02 ENCOUNTER — Other Ambulatory Visit: Payer: Self-pay | Admitting: Obstetrics and Gynecology

## 2015-08-26 ENCOUNTER — Ambulatory Visit (HOSPITAL_COMMUNITY)
Admission: EM | Admit: 2015-08-26 | Discharge: 2015-08-26 | Disposition: A | Discharge status: Home / Self Care | Payer: BLUE CROSS/BLUE SHIELD | Attending: Emergency Medicine | Admitting: Emergency Medicine

## 2015-08-26 ENCOUNTER — Encounter (HOSPITAL_COMMUNITY): Payer: Self-pay | Admitting: *Deleted

## 2015-08-26 DIAGNOSIS — J40 Bronchitis, not specified as acute or chronic: Secondary | ICD-10-CM | POA: Diagnosis not present

## 2015-08-26 MED ORDER — PREDNISONE 50 MG PO TABS: ORAL_TABLET | ORAL | 0 refills | Status: DC

## 2015-08-26 MED ORDER — ALBUTEROL SULFATE HFA 108 (90 BASE) MCG/ACT IN AERS: 2.0000 | INHALATION_SPRAY | RESPIRATORY_TRACT | 0 refills | Status: DC | PRN

## 2015-08-26 MED ORDER — AZITHROMYCIN 250 MG PO TABS: ORAL_TABLET | ORAL | 0 refills | Status: DC

## 2015-08-26 MED ORDER — IPRATROPIUM-ALBUTEROL 0.5-2.5 (3) MG/3ML IN SOLN
RESPIRATORY_TRACT | Status: AC
  Filled 2015-08-26: qty 3

## 2015-08-26 MED ORDER — IPRATROPIUM-ALBUTEROL 0.5-2.5 (3) MG/3ML IN SOLN
3.0000 mL | Freq: Once | RESPIRATORY_TRACT | Status: AC
  Administered 2015-08-26: 3 mL via RESPIRATORY_TRACT

## 2015-08-26 NOTE — ED Triage Notes (Signed)
Patient reports cough and chest congestion x 1 week with no relief from over the counter medication. No fevers. Cough in nonproductive. Denies sore throat or nasal drainage. Mild earaches.

## 2015-08-26 NOTE — Discharge Instructions (Signed)
You have bronchitis. Take azithromycin and prednisone as prescribed. Use the albuterol every 4 hours as needed for wheezing or cough. You should see improvement in the next 3-5 days. If you develop fevers, difficulty breathing, or are just not getting better, please come back or go to the emergency room.

## 2015-08-26 NOTE — ED Provider Notes (Signed)
[facility]    CSN: [id number] Arrival date & time: 08/26/15  1202  First Provider Contact:  First MD Initiated Contact with Patient 08/26/15 1257        History   Chief Complaint Chief Complaint  Patient presents with  . Cough    HPI Cheryl Rice is a 40 y.o. female.   She is a 40 year old woman here for evaluation of cough. She states this started about 10-14 days ago with sinus drainage, congestion, and scratchy throat. After a few days it moved into her chest where it has remained. She reports a nonproductive cough. She denies wheezing or shortness of breath, but does state her chest feels tight. No fevers. Currently denies any sinus or nasal symptoms. She's been taking Mucinex without improvement. She has had several episodes of bronchitis in the last several years and states this feels similar.    Past Medical History:  Diagnosis Date  . Fibroid uterus   . Sickle cell trait ([place])   . Syncope and collapse 11/05/2012    Patient Active Problem List   Diagnosis Date Noted  . Syncope and collapse 11/05/2012    Past Surgical History:  Procedure Laterality Date  . WISDOM TOOTH EXTRACTION      OB History    No data available       Home Medications    Prior to Admission medications   Medication Sig Start Date End Date Taking? Authorizing Provider  albuterol (PROVENTIL HFA;VENTOLIN HFA) 108 (90 Base) MCG/ACT inhaler Inhale 2 puffs into the lungs every 4 (four) hours as needed for wheezing or shortness of breath. 08/26/15   Melony Overly, MD  azithromycin (ZITHROMAX Z-PAK) 250 MG tablet Take 2 pills today, then 1 pill daily until gone. 08/26/15   Melony Overly, MD  levonorgestrel-ethinyl estradiol (ORSYTHIA) 0.1-20 MG-MCG tablet Take 1 tablet by mouth daily.    Historical Provider, MD  predniSONE (DELTASONE) 50 MG tablet Take 1 pill daily for 5 days. 08/26/15   Melony Overly, MD    Family History Family History  Problem Relation Age of Onset  . Diabetes  Mother   . Hypertension Mother   . Prostate cancer Father   . Glaucoma Father   . Hypertension Father   . Alcohol abuse Father     as well as tobacco  . Diabetes Paternal Grandmother     on dialysis  . Diabetes Maternal Grandmother   . Diabetes Maternal Aunt     several mat aunts    Social History Social History  Substance Use Topics  . Smoking status: Never Smoker  . Smokeless tobacco: Never Used  . Alcohol use No     Allergies   Review of patient's allergies indicates no known allergies.   Review of Systems Review of Systems  Constitutional: Negative for fever.  HENT: Negative for congestion, rhinorrhea and sore throat.   Respiratory: Positive for cough and chest tightness. Negative for shortness of breath and wheezing.   Cardiovascular: Negative for chest pain.     Physical Exam Triage Vital Signs ED Triage Vitals [08/26/15 1246]  Enc Vitals Group     BP 108/74     Pulse Rate 78     Resp 19     Temp 98 F (36.7 C)     Temp Source Oral     SpO2 98 %     Weight      Height      Head Circumference  Peak Flow      Pain Score      Pain Loc      Pain Edu?      Excl. in [place]?    No data found.   Updated Vital Signs BP 108/74 (BP Location: Right Arm)   Pulse 78   Temp 98 F (36.7 C) (Oral)   Resp 19   LMP 08/24/2015   SpO2 98%   Visual Acuity Right Eye Distance:   Left Eye Distance:   Bilateral Distance:    Right Eye Near:   Left Eye Near:    Bilateral Near:     Physical Exam  Constitutional: She is oriented to person, place, and time. She appears well-developed and well-nourished. No distress.  HENT:  Nose: Nose normal.  Mouth/Throat: No oropharyngeal exudate.  TMs normal bilaterally  Neck: Neck supple.  Cardiovascular: Normal rate, regular rhythm and normal heart sounds.   No murmur heard. Pulmonary/Chest: Effort normal and breath sounds normal. She has no wheezes. She has no rales.  Fair air movement  Lymphadenopathy:    She has  no cervical adenopathy.  Neurological: She is alert and oriented to person, place, and time.     UC Treatments / Results  Labs (all labs ordered are listed, but only abnormal results are displayed) Labs Reviewed - No data to display  EKG  EKG Interpretation None       Radiology No results found.  Procedures Procedures (including critical care time)  Medications Ordered in UC Medications  ipratropium-albuterol (DUONEB) 0.5-2.5 (3) MG/3ML nebulizer solution 3 mL (3 mLs Nebulization Given 08/26/15 1316)     Initial Impression / Assessment and Plan / UC Course  I have reviewed the triage vital signs and the nursing notes.  Pertinent labs & imaging results that were available during my care of the patient were reviewed by me and considered in my medical decision making (see chart for details).  Clinical Course    Air movement improved after DuoNeb.  We'll treat for bronchitis with azithromycin and prednisone. Albuterol inhaler prescription given. Follow-up as needed.  Final Clinical Impressions(s) / UC Diagnoses   Final diagnoses:  Bronchitis    New Prescriptions Discharge Medication List as of 08/26/2015  1:40 PM    START taking these medications   Details  albuterol (PROVENTIL HFA;VENTOLIN HFA) 108 (90 Base) MCG/ACT inhaler Inhale 2 puffs into the lungs every 4 (four) hours as needed for wheezing or shortness of breath., Starting Sun 08/26/2015, Normal         Melony Overly, MD 08/26/15 1409

## 2015-08-27 ENCOUNTER — Encounter (HOSPITAL_COMMUNITY): Payer: Self-pay | Admitting: Emergency Medicine

## 2015-08-27 ENCOUNTER — Emergency Department (HOSPITAL_COMMUNITY)
Admission: EM | Admit: 2015-08-27 | Discharge: 2015-08-27 | Disposition: A | Discharge status: Home / Self Care | Payer: BLUE CROSS/BLUE SHIELD | Attending: Emergency Medicine | Admitting: Emergency Medicine

## 2015-08-27 ENCOUNTER — Emergency Department (HOSPITAL_COMMUNITY): Payer: BLUE CROSS/BLUE SHIELD

## 2015-08-27 DIAGNOSIS — R058 Other specified cough: Secondary | ICD-10-CM

## 2015-08-27 DIAGNOSIS — R05 Cough: Secondary | ICD-10-CM | POA: Insufficient documentation

## 2015-08-27 MED ORDER — IPRATROPIUM-ALBUTEROL 0.5-2.5 (3) MG/3ML IN SOLN
3.0000 mL | Freq: Once | RESPIRATORY_TRACT | Status: AC
  Administered 2015-08-27: 3 mL via RESPIRATORY_TRACT
  Filled 2015-08-27: qty 3

## 2015-08-27 MED ORDER — HYDROCODONE-ACETAMINOPHEN 5-325 MG PO TABS: 1.0000 | ORAL_TABLET | ORAL | 0 refills | Status: DC | PRN

## 2015-08-27 MED ORDER — PREDNISONE 20 MG PO TABS
60.0000 mg | ORAL_TABLET | Freq: Once | ORAL | Status: AC
  Administered 2015-08-27: 60 mg via ORAL
  Filled 2015-08-27: qty 3

## 2015-08-27 MED ORDER — BENZONATATE 100 MG PO CAPS: 100.0000 mg | ORAL_CAPSULE | Freq: Three times a day (TID) | ORAL | 0 refills | Status: DC | PRN

## 2015-08-27 NOTE — Discharge Instructions (Signed)
Continue taking the prednisone and Zithromax. Continue using the inhaler as needed.

## 2015-08-27 NOTE — ED Triage Notes (Signed)
Pt. reports persistent dry cough and  sore throat onset this evening seen at [facility] urgent care today diagnosed with Bronchitis discharged home with prescription - Zithromax , Prednisone and inhaler . Denies fever or chills.

## 2015-08-27 NOTE — ED Provider Notes (Signed)
[facility] DEPT Provider Note   CSN: [id number] Arrival date & time: 08/27/15  0119  First Provider Contact:  First MD Initiated Contact with Patient 08/27/15 [phone]        History   Chief Complaint Chief Complaint  Patient presents with  . Cough  . Sore Throat    HPI Cheryl Rice is a 40 y.o. female.  The history is provided by the patient.  Cough   Sore Throat   She has been sick for the last 4 days with a nonproductive cough. She denies fever, chills, sweats. She went to urgent care yesterday and was given prescription for prednisone, azithromycin, and albuterol inhaler. Tonight, she used inhaler but only had relief for less than 10 minutes. She continues to have a nonproductive cough which is keeping her from sleeping. She denies chest pain although, her throat is now sore from all the coughing. She denies nausea or vomiting. There's been no arthralgias or myalgias. She denies any sick contacts. She usually gets bronchitis in the winter or spring but did not get it this year.  Past Medical History:  Diagnosis Date  . Fibroid uterus   . Sickle cell trait ([place])   . Syncope and collapse 11/05/2012    Patient Active Problem List   Diagnosis Date Noted  . Syncope and collapse 11/05/2012    Past Surgical History:  Procedure Laterality Date  . WISDOM TOOTH EXTRACTION      OB History    No data available       Home Medications    Prior to Admission medications   Medication Sig Start Date End Date Taking? Authorizing Provider  albuterol (PROVENTIL HFA;VENTOLIN HFA) 108 (90 Base) MCG/ACT inhaler Inhale 2 puffs into the lungs every 4 (four) hours as needed for wheezing or shortness of breath. 08/26/15   Melony Overly, MD  azithromycin (ZITHROMAX Z-PAK) 250 MG tablet Take 2 pills today, then 1 pill daily until gone. 08/26/15   Melony Overly, MD  levonorgestrel-ethinyl estradiol (ORSYTHIA) 0.1-20 MG-MCG tablet Take 1 tablet by mouth daily.    Historical Provider, MD    predniSONE (DELTASONE) 50 MG tablet Take 1 pill daily for 5 days. 08/26/15   Melony Overly, MD    Family History Family History  Problem Relation Age of Onset  . Diabetes Mother   . Hypertension Mother   . Prostate cancer Father   . Glaucoma Father   . Hypertension Father   . Alcohol abuse Father     as well as tobacco  . Diabetes Paternal Grandmother     on dialysis  . Diabetes Maternal Grandmother   . Diabetes Maternal Aunt     several mat aunts    Social History Social History  Substance Use Topics  . Smoking status: Never Smoker  . Smokeless tobacco: Never Used  . Alcohol use No     Allergies   Review of patient's allergies indicates no known allergies.   Review of Systems Review of Systems  Respiratory: Positive for cough.   All other systems reviewed and are negative.    Physical Exam Updated Vital Signs BP 105/70 (BP Location: Left Arm)   Pulse 74   Temp 98.1 F (36.7 C) (Oral)   Resp 18   Ht 5' 6.5" (1.689 m)   Wt 148 lb (67.1 kg)   LMP 08/24/2015   SpO2 98%   BMI 23.53 kg/m   Physical Exam  Nursing note and vitals reviewed.  40 year old  female, resting comfortably and in no acute distress. Vital signs are normal. Oxygen saturation is 98%, which is normal. Head is normocephalic and atraumatic. PERRLA, EOMI. Oropharynx is clear. Neck is nontender and supple without adenopathy or JVD. Back is nontender and there is no CVA tenderness. Lungs are clear without rales, wheezes, or rhonchi. Chest is nontender. Heart has regular rate and rhythm without murmur. Abdomen is soft, flat, nontender without masses or hepatosplenomegaly and peristalsis is normoactive. Extremities have no cyanosis or edema, full range of motion is present. Skin is warm and dry without rash. Neurologic: Mental status is normal, cranial nerves are intact, there are no motor or sensory deficits.  ED Treatments / Results   Radiology Dg Chest 2 View  Result Date:  08/27/2015 CLINICAL DATA:  40 year old female with cough EXAM: CHEST  2 VIEW COMPARISON:  Chest radiograph dated 10/19/2012 FINDINGS: The heart size and mediastinal contours are within normal limits. Both lungs are clear. The visualized skeletal structures are unremarkable. IMPRESSION: No active cardiopulmonary disease. Electronically Signed   By: Anner Crete M.D.   On: 08/27/2015 05:11   Procedures Procedures (including critical care time)  Medications Ordered in ED Medications  predniSONE (DELTASONE) tablet 60 mg (not administered)  ipratropium-albuterol (DUONEB) 0.5-2.5 (3) MG/3ML nebulizer solution 3 mL (3 mLs Nebulization Given 08/27/15 0438)     Initial Impression / Assessment and Plan / ED Course  I have reviewed the triage vital signs and the nursing notes.  Pertinent labs & imaging results that were available during my care of the patient were reviewed by me and considered in my medical decision making (see chart for details).  Clinical Course   Cough which has not responded to initial treatment with azithromycin, prednisone, albuterol inhaler. Old records are reviewed confirming occasional visits for bronchitis. She was seen in urgent care yesterday and given DuoNeb treatment and prescriptions for prednisone and azithromycin. Her cough in the ED does not sound wheezy. Patient states it does feel like a scratch vertical in her throat. This may be more amenable to benzonatate treatment. She will be sent for chest x-ray to rule out pneumonia and she'll be given another nebulizer treatment with albuterol with ipratropium.  She had good relief of cough with albuterol with ipratropium. Chest x-ray shows no evidence of pneumonia. She is advised to continue her prednisone course and continue her Z-Pak course. She is given a prescription for benzonatate and also given a prescription for hydrocodone-acetaminophen used at night for cough suppression. She expresses concern about whether she  might have chronic bronchitis or asthma. I have recommended that she discuss with her PCP possible referral for pulmonary function studies.  Final Clinical Impressions(s) / ED Diagnoses   Final diagnoses:  None    New Prescriptions New Prescriptions   No medications on file     Delora Fuel, MD [id number]

## 2015-11-26 ENCOUNTER — Other Ambulatory Visit: Payer: Self-pay | Admitting: Obstetrics and Gynecology

## 2016-12-30 ENCOUNTER — Emergency Department (HOSPITAL_COMMUNITY): Payer: BLUE CROSS/BLUE SHIELD

## 2016-12-30 ENCOUNTER — Other Ambulatory Visit: Payer: Self-pay

## 2016-12-30 ENCOUNTER — Encounter (HOSPITAL_COMMUNITY): Payer: Self-pay | Admitting: *Deleted

## 2016-12-30 ENCOUNTER — Emergency Department (HOSPITAL_COMMUNITY)
Admission: EM | Admit: 2016-12-30 | Discharge: 2016-12-30 | Disposition: A | Discharge status: Home / Self Care | Payer: BLUE CROSS/BLUE SHIELD | Attending: Emergency Medicine | Admitting: Emergency Medicine

## 2016-12-30 DIAGNOSIS — Z79899 Other long term (current) drug therapy: Secondary | ICD-10-CM | POA: Insufficient documentation

## 2016-12-30 DIAGNOSIS — R002 Palpitations: Secondary | ICD-10-CM | POA: Diagnosis present

## 2016-12-30 LAB — I-STAT TROPONIN, ED: TROPONIN I, POC: 0 ng/mL (ref 0.00–0.08)

## 2016-12-30 LAB — I-STAT BETA HCG BLOOD, ED (MC, WL, AP ONLY)

## 2016-12-30 LAB — BASIC METABOLIC PANEL
Anion gap: 8 (ref 5–15)
BUN: 13 mg/dL (ref 6–20)
CALCIUM: 9.2 mg/dL (ref 8.9–10.3)
CO2: 23 mmol/L (ref 22–32)
CREATININE: 1.01 mg/dL — AB (ref 0.44–1.00)
Chloride: 106 mmol/L (ref 101–111)
Glucose, Bld: 74 mg/dL (ref 65–99)
Potassium: 3.3 mmol/L — ABNORMAL LOW (ref 3.5–5.1)
SODIUM: 137 mmol/L (ref 135–145)

## 2016-12-30 LAB — CBC
HCT: 35.7 % — ABNORMAL LOW (ref 36.0–46.0)
Hemoglobin: 12.2 g/dL (ref 12.0–15.0)
MCH: 28.1 pg (ref 26.0–34.0)
MCHC: 34.2 g/dL (ref 30.0–36.0)
MCV: 82.3 fL (ref 78.0–100.0)
PLATELETS: 193 10*3/uL (ref 150–400)
RBC: 4.34 MIL/uL (ref 3.87–5.11)
RDW: 13 % (ref 11.5–15.5)
WBC: 4.2 10*3/uL (ref 4.0–10.5)

## 2016-12-30 NOTE — ED Provider Notes (Signed)
[facility] DEPARTMENT Provider Note   CSN: [id number] Arrival date & time: 12/30/16  0830   History   Chief Complaint Chief Complaint  Patient presents with  . Palpitations    HPI Cheryl Rice is a 41 y.o. female.  HPI   41 year old female presents today with complaints of palpitations.  Patient notes that she recently started Lexapro last week.  She notes after taking several doses of the Lexapro she developed hot flashes and palpitations.  Patient notes that these are worse at night when she is laying in bed noting she can feel her heart beating.  She notes these come and go, currently asymptomatic.  She denies any associated shortness of breath or chest pain.  Patient reports that she does not use drugs, drinks several sodas throughout the day but no other sources of caffeine.  She denies any personal cardiac history.  She notes a father with a history of atrial fibrillation.  She notes she has been under extreme stress lately and was taking Lexapro for anxiety.    Past Medical History:  Diagnosis Date  . Fibroid uterus   . Sickle cell trait ([place])   . Syncope and collapse 11/05/2012    Patient Active Problem List   Diagnosis Date Noted  . Syncope and collapse 11/05/2012    Past Surgical History:  Procedure Laterality Date  . WISDOM TOOTH EXTRACTION      OB History    No data available       Home Medications    Prior to Admission medications   Medication Sig Start Date End Date Taking? Authorizing Provider  albuterol (PROVENTIL HFA;VENTOLIN HFA) 108 (90 Base) MCG/ACT inhaler Inhale 2 puffs into the lungs every 4 (four) hours as needed for wheezing or shortness of breath. 08/26/15   Melony Overly, MD  azithromycin (ZITHROMAX Z-PAK) 250 MG tablet Take 2 pills today, then 1 pill daily until gone. 08/26/15   Melony Overly, MD  benzonatate (TESSALON) 100 MG capsule Take 1 capsule (100 mg total) by mouth every 8 (eight) hours as needed for  cough. 3/53/61   Delora Fuel, MD  HYDROcodone-acetaminophen (NORCO) 5-325 MG tablet Take 1 tablet by mouth every 4 (four) hours as needed for moderate pain (or coughing). 4/43/15   Delora Fuel, MD  levonorgestrel-ethinyl estradiol (ORSYTHIA) 0.1-20 MG-MCG tablet Take 1 tablet by mouth daily.    [provider]  predniSONE (DELTASONE) 50 MG tablet Take 1 pill daily for 5 days. 08/26/15   Melony Overly, MD    Family History Family History  Problem Relation Age of Onset  . Diabetes Mother   . Hypertension Mother   . Prostate cancer Father   . Glaucoma Father   . Hypertension Father   . Alcohol abuse Father        as well as tobacco  . Diabetes Paternal Grandmother        on dialysis  . Diabetes Maternal Grandmother   . Diabetes Maternal Aunt        several mat aunts    Social History Social History   Tobacco Use  . Smoking status: Never Smoker  . Smokeless tobacco: Never Used  Substance Use Topics  . Alcohol use: No  . Drug use: No     Allergies   Patient has no known allergies.   Review of Systems Review of Systems  All other systems reviewed and are negative.   Physical Exam Updated Vital Signs BP 100/72 (BP  Location: Right Arm)   Pulse 64   Temp 98.1 F (36.7 C) (Oral)   Resp 15   LMP 12/16/2016   SpO2 100%   Physical Exam  Constitutional: She is oriented to person, place, and time. She appears well-developed and well-nourished.  HENT:  Head: Normocephalic and atraumatic.  Eyes: Conjunctivae are normal. Pupils are equal, round, and reactive to light. Right eye exhibits no discharge. Left eye exhibits no discharge. No scleral icterus.  Neck: Normal range of motion. No JVD present. No tracheal deviation present.  Pulmonary/Chest: Effort normal. No stridor.  Neurological: She is alert and oriented to person, place, and time. Coordination normal.  Psychiatric: She has a normal mood and affect. Her behavior is normal. Judgment and thought content  normal.  Nursing note and vitals reviewed.    ED Treatments / Results  Labs (all labs ordered are listed, but only abnormal results are displayed) Labs Reviewed  BASIC METABOLIC PANEL - Abnormal; Notable for the following components:      Result Value   Potassium 3.3 (*)    Creatinine, Ser 1.01 (*)    All other components within normal limits  CBC - Abnormal; Notable for the following components:   HCT 35.7 (*)    All other components within normal limits  I-STAT TROPONIN, ED  I-STAT BETA HCG BLOOD, ED ([facility], [facility], [facility] ONLY)    EKG  EKG Interpretation None       Radiology Dg Chest 2 View  Result Date: 12/30/2016 CLINICAL DATA:  41 year old female with palpitations for the past month. Nonsmoker. Initial encounter. EXAM: CHEST  2 VIEW COMPARISON:  08/27/2015 chest x-ray. FINDINGS: No infiltrate, congestive heart failure or pneumothorax. Minimal pleural thickening right lung apex unchanged. No plain film evidence of pulmonary malignancy. Heart size within normal limits. No acute osseous abnormality. IMPRESSION: No active cardiopulmonary disease. Electronically Signed   By: Genia Del M.D.   On: 12/30/2016 09:17    Procedures Procedures (including critical care time)  Medications Ordered in ED Medications - No data to display   Initial Impression / Assessment and Plan / ED Course  I have reviewed the triage vital signs and the nursing notes.  Pertinent labs & imaging results that were available during my care of the patient were reviewed by me and considered in my medical decision making (see chart for details).     Final Clinical Impressions(s) / ED Diagnoses   Final diagnoses:  Palpitations    Labs: I-STAT troponin, i-STAT beta-hCG, BMP, CBC  Imaging: DG Chest 2 View-ED EKG with no signs of ischemia, normal sinus rhythm  Consults:  Therapeutics:  Discharge Meds:   Assessment/Plan: 41 year old female presents today with complaints of palpitations.  Patient  is asymptomatic at time of evaluation.  She has reassuring workup here.  These could be secondary to new medication, anxiety or stress, low suspicion for structural cardiac disease or any other acute life-threatening etiology.  Patient referred to cardiology, encouraged to return immediately with any new or worsening signs or symptoms.  Patient verbalized understanding and agreement to today's plan had no further questions or concerns the time discharge.    ED Discharge Orders    None       Francee Gentile 12/30/16 2126    Valarie Merino, MD 01/05/17 [id number]

## 2016-12-30 NOTE — ED Triage Notes (Addendum)
Pt reports waking up with palpitations this am, feels like heart is racing. Denies sob. Recently started on lexapro and had these episodes multiple time since. HR 73 at triage.

## 2016-12-30 NOTE — Discharge Instructions (Signed)
Please read attached information. If you experience any new or worsening signs or symptoms please return to the emergency room for evaluation. Please follow-up with your primary care provider or specialist as discussed.  °

## 2017-02-04 ENCOUNTER — Other Ambulatory Visit: Payer: Self-pay | Admitting: Obstetrics and Gynecology

## 2017-02-04 DIAGNOSIS — E049 Nontoxic goiter, unspecified: Secondary | ICD-10-CM

## 2017-02-06 ENCOUNTER — Ambulatory Visit
Admission: RE | Admit: 2017-02-06 | Discharge: 2017-02-06 | Disposition: A | Discharge status: Home / Self Care | Payer: BLUE CROSS/BLUE SHIELD | Source: Ambulatory Visit | Attending: Obstetrics and Gynecology | Admitting: Obstetrics and Gynecology

## 2017-02-06 DIAGNOSIS — E049 Nontoxic goiter, unspecified: Secondary | ICD-10-CM

## 2017-02-26 ENCOUNTER — Other Ambulatory Visit: Payer: Self-pay | Admitting: Obstetrics and Gynecology

## 2017-07-05 IMAGING — CR DG CHEST 2V
2 series · 2 of 2 positions shown · non-contrast
Comparison: Chest radiograph dated 10/19/2012

CLINICAL DATA: 40-year-old female with cough

EXAM:
CHEST  2 VIEW

[chest pa]
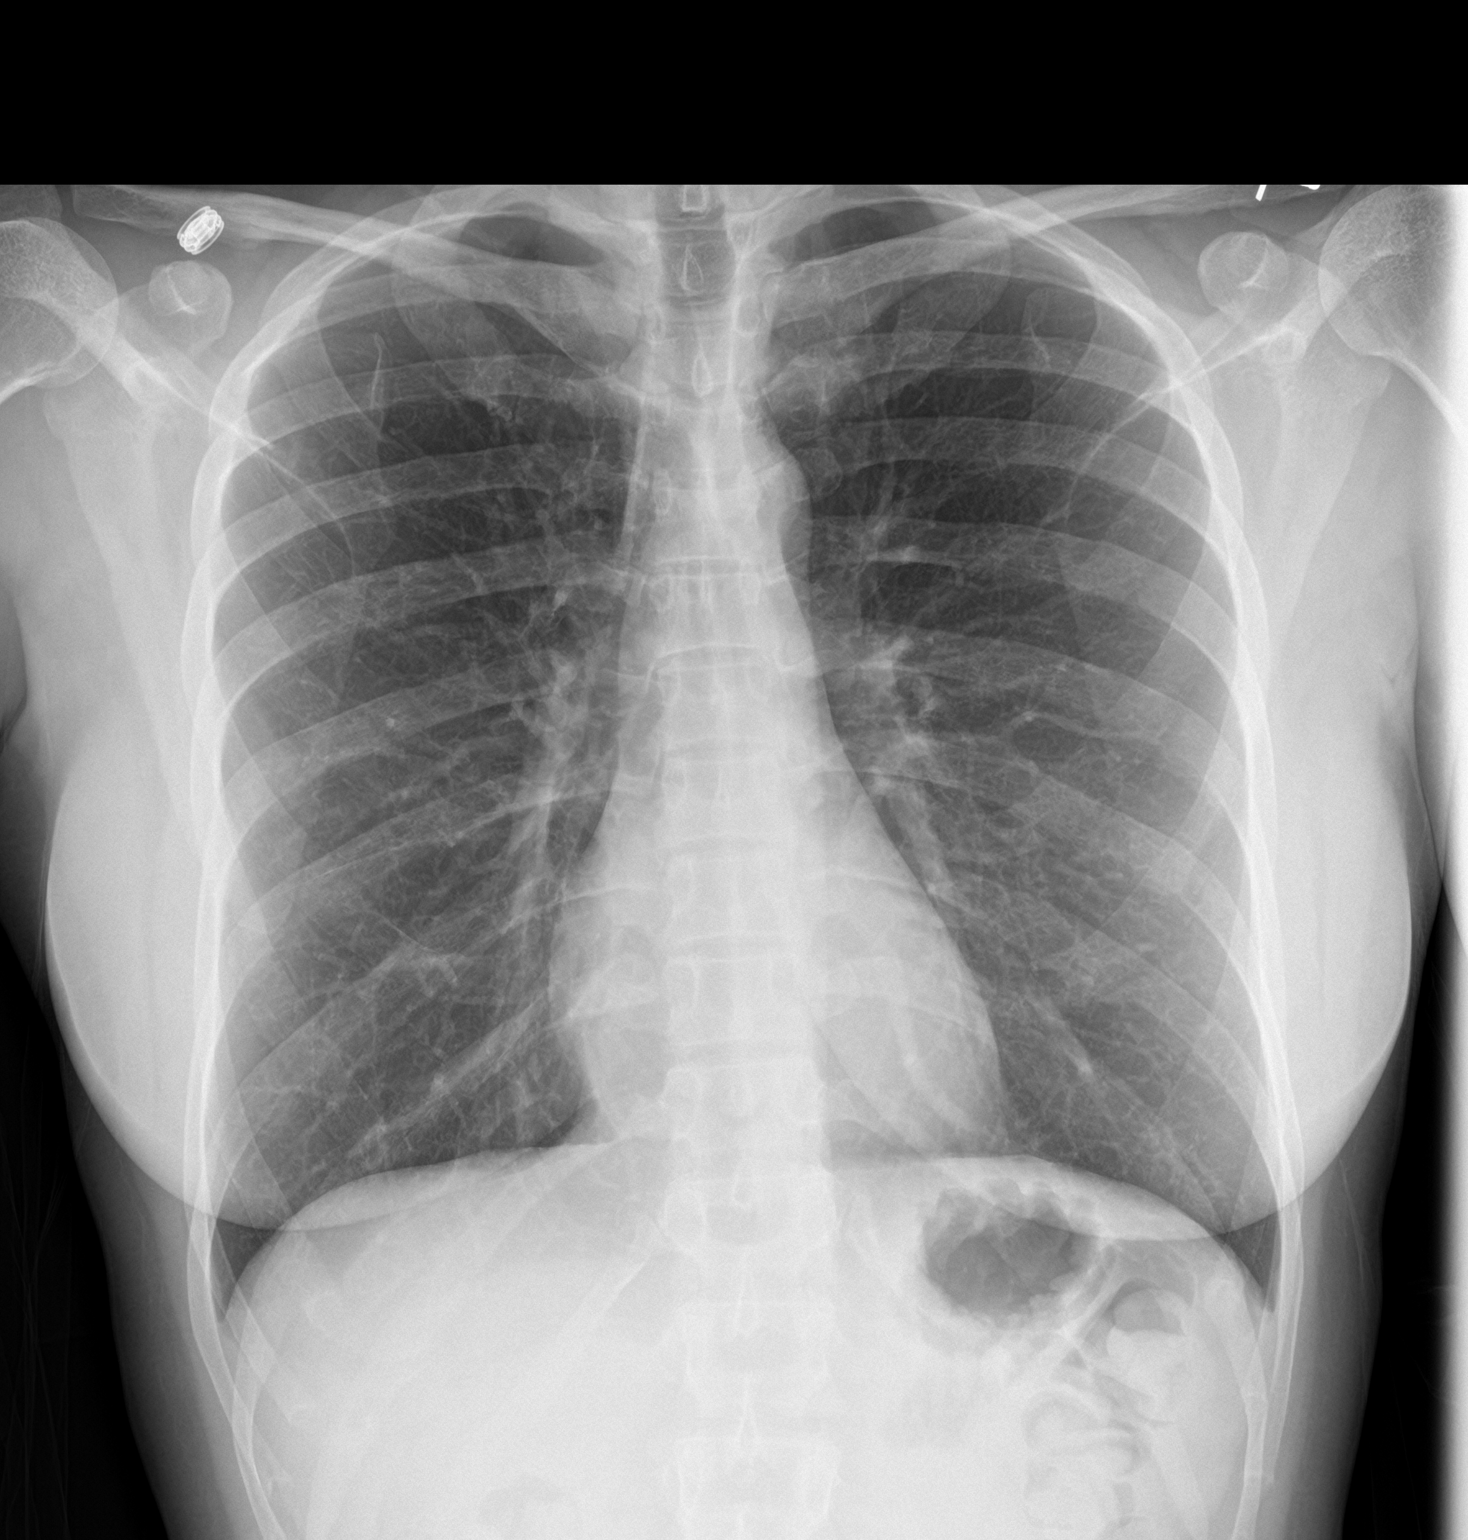

[chest lat]
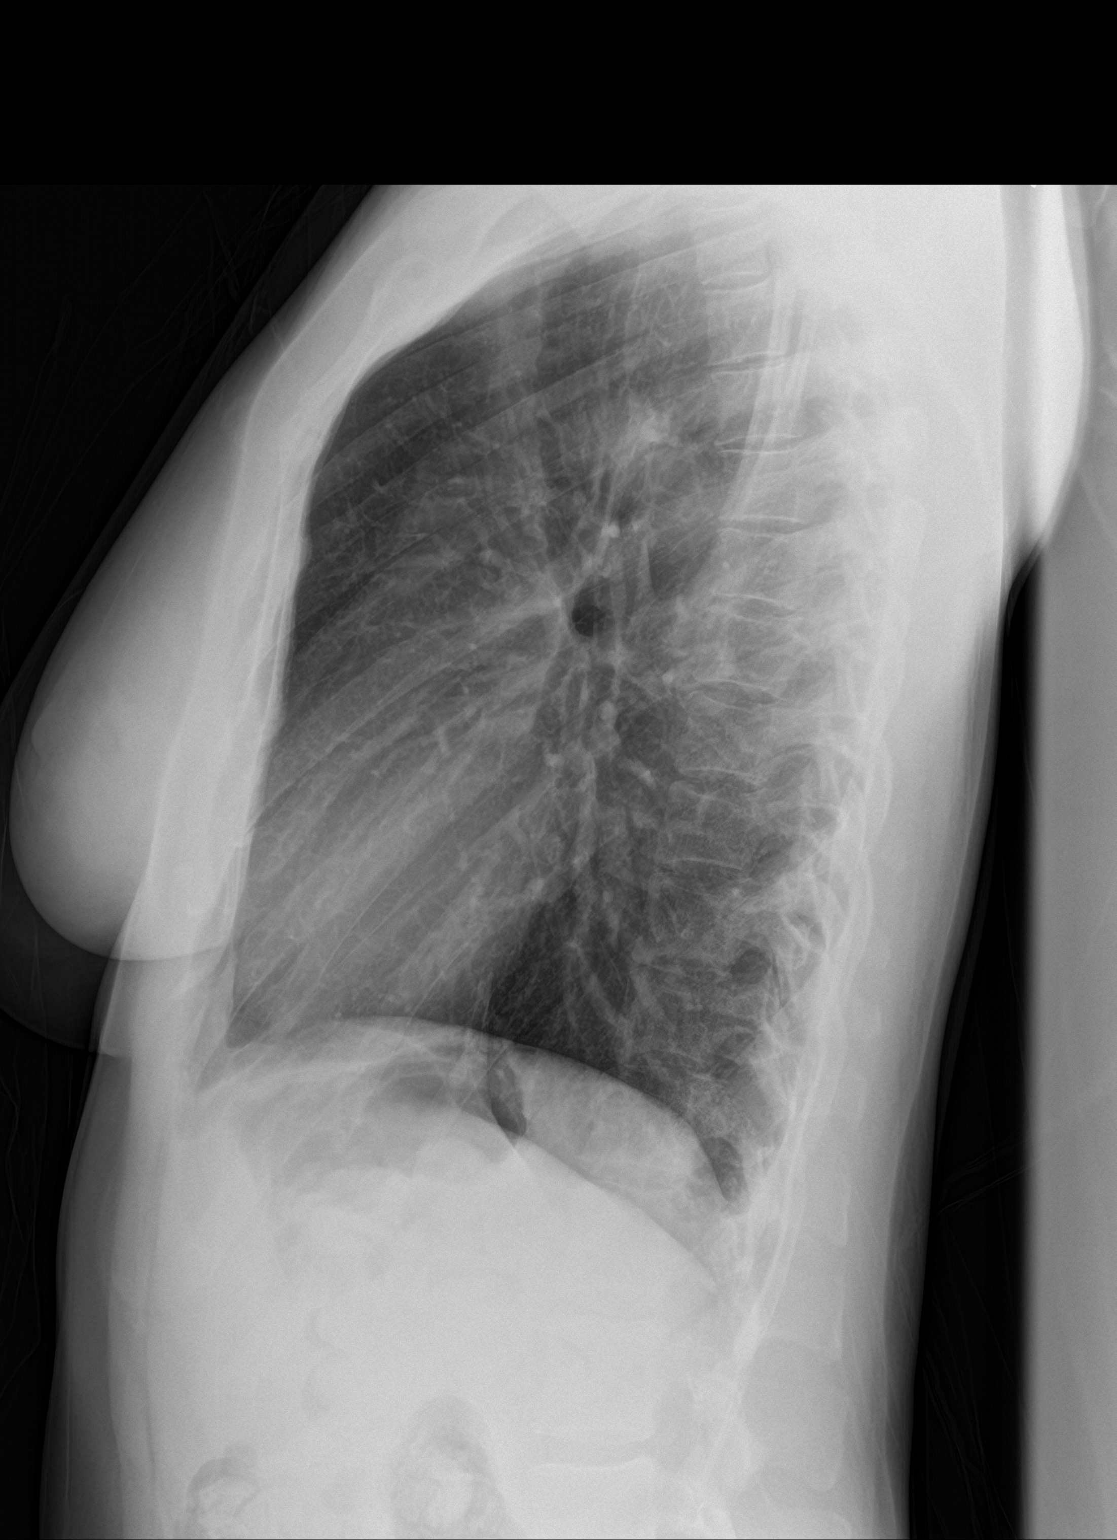

[2 of 2 positions shown; findings below may reference images not displayed]

FINDINGS: The heart size and mediastinal contours are within normal limits.
Both lungs are clear. The visualized skeletal structures are
unremarkable.
IMPRESSION: No active cardiopulmonary disease.

## 2018-02-25 ENCOUNTER — Other Ambulatory Visit: Payer: Self-pay | Admitting: Obstetrics and Gynecology

## 2018-02-25 DIAGNOSIS — N6320 Unspecified lump in the left breast, unspecified quadrant: Secondary | ICD-10-CM

## 2018-03-02 ENCOUNTER — Ambulatory Visit
Admission: RE | Admit: 2018-03-02 | Discharge: 2018-03-02 | Disposition: A | Discharge status: Home / Self Care | Payer: BLUE CROSS/BLUE SHIELD | Source: Ambulatory Visit | Attending: Obstetrics and Gynecology | Admitting: Obstetrics and Gynecology

## 2018-03-02 DIAGNOSIS — N6320 Unspecified lump in the left breast, unspecified quadrant: Secondary | ICD-10-CM

## 2018-03-06 ENCOUNTER — Emergency Department (HOSPITAL_COMMUNITY)
Admission: EM | Admit: 2018-03-06 | Discharge: 2018-03-07 | Disposition: A | Discharge status: Home / Self Care | Payer: BLUE CROSS/BLUE SHIELD | Attending: Emergency Medicine | Admitting: Emergency Medicine

## 2018-03-06 ENCOUNTER — Other Ambulatory Visit: Payer: Self-pay

## 2018-03-06 DIAGNOSIS — N939 Abnormal uterine and vaginal bleeding, unspecified: Secondary | ICD-10-CM | POA: Diagnosis not present

## 2018-03-06 DIAGNOSIS — R102 Pelvic and perineal pain: Secondary | ICD-10-CM | POA: Insufficient documentation

## 2018-03-06 DIAGNOSIS — R319 Hematuria, unspecified: Secondary | ICD-10-CM | POA: Diagnosis present

## 2018-03-06 NOTE — ED Triage Notes (Signed)
Patient c/o bloody urine today and nausea x 2 days.

## 2018-03-07 LAB — CBC
HCT: 36.1 % (ref 36.0–46.0)
Hemoglobin: 11.9 g/dL — ABNORMAL LOW (ref 12.0–15.0)
MCH: 27.9 pg (ref 26.0–34.0)
MCHC: 33 g/dL (ref 30.0–36.0)
MCV: 84.5 fL (ref 80.0–100.0)
NRBC: 0 % (ref 0.0–0.2)
PLATELETS: 187 10*3/uL (ref 150–400)
RBC: 4.27 MIL/uL (ref 3.87–5.11)
RDW: 13.3 % (ref 11.5–15.5)
WBC: 7.6 10*3/uL (ref 4.0–10.5)

## 2018-03-07 LAB — URINALYSIS, ROUTINE W REFLEX MICROSCOPIC
BILIRUBIN URINE: NEGATIVE
Bacteria, UA: NONE SEEN
Glucose, UA: NEGATIVE mg/dL
KETONES UR: NEGATIVE mg/dL
LEUKOCYTES UA: NEGATIVE
Nitrite: NEGATIVE
PROTEIN: NEGATIVE mg/dL
Specific Gravity, Urine: 1.014 (ref 1.005–1.030)
pH: 6 (ref 5.0–8.0)

## 2018-03-07 LAB — WET PREP, GENITAL
Clue Cells Wet Prep HPF POC: NONE SEEN
Sperm: NONE SEEN
Trich, Wet Prep: NONE SEEN
YEAST WET PREP: NONE SEEN

## 2018-03-07 LAB — PREGNANCY, URINE: Preg Test, Ur: NEGATIVE

## 2018-03-07 NOTE — ED Notes (Signed)
Pt refused discharge vital signs

## 2018-03-07 NOTE — ED Notes (Signed)
Pelvic cart set up at bedside  

## 2018-03-07 NOTE — ED Notes (Signed)
Patient verbalizes understanding of discharge instructions. Opportunity for questioning and answers were provided. Armband removed by staff, pt discharged from ED ambulatory.   

## 2018-03-07 NOTE — ED Provider Notes (Addendum)
[facility] EMERGENCY DEPARTMENT Provider Note   CSN: [id number] Arrival date & time: 03/06/18  2344     History   Chief Complaint Chief Complaint  Patient presents with  . Hematuria    HPI Cheryl Rice is a 43 y.o. female.  Patient presents to the emergency department with a chief complaint of hematuria.  She states that she noticed some blood in her urine today, but is actually uncertain whether it is coming from her vagina or from her rectum.  He states that it is bright red blood.  She reports having some suprapubic discomfort.  States that she finished her last menstrual cycle 2 weeks ago.  She reports having some associated nausea, but denies any vomiting.  Denies any fever, chills, flank pain, or back pain.  Denies any treatments prior to arrival.  Denies any other associated symptoms.  The history is provided by the patient. No language interpreter was used.    Past Medical History:  Diagnosis Date  . Fibroid uterus   . Sickle cell trait ([place])   . Syncope and collapse 11/05/2012    Patient Active Problem List   Diagnosis Date Noted  . Syncope and collapse 11/05/2012    Past Surgical History:  Procedure Laterality Date  . WISDOM TOOTH EXTRACTION       OB History   No obstetric history on file.      Home Medications    Prior to Admission medications   Medication Sig Start Date End Date Taking? Authorizing Provider  albuterol (PROVENTIL HFA;VENTOLIN HFA) 108 (90 Base) MCG/ACT inhaler Inhale 2 puffs into the lungs every 4 (four) hours as needed for wheezing or shortness of breath. 08/26/15   Melony Overly, MD  azithromycin (ZITHROMAX Z-PAK) 250 MG tablet Take 2 pills today, then 1 pill daily until gone. 08/26/15   Melony Overly, MD  benzonatate (TESSALON) 100 MG capsule Take 1 capsule (100 mg total) by mouth every 8 (eight) hours as needed for cough. 9/52/84   Delora Fuel, MD  HYDROcodone-acetaminophen (NORCO) 5-325 MG tablet Take 1  tablet by mouth every 4 (four) hours as needed for moderate pain (or coughing). 1/32/44   Delora Fuel, MD  levonorgestrel-ethinyl estradiol (ORSYTHIA) 0.1-20 MG-MCG tablet Take 1 tablet by mouth daily.    [provider]  predniSONE (DELTASONE) 50 MG tablet Take 1 pill daily for 5 days. 08/26/15   Melony Overly, MD    Family History Family History  Problem Relation Age of Onset  . Diabetes Mother   . Hypertension Mother   . Prostate cancer Father   . Glaucoma Father   . Hypertension Father   . Alcohol abuse Father        as well as tobacco  . Diabetes Paternal Grandmother        on dialysis  . Diabetes Maternal Grandmother   . Diabetes Maternal Aunt        several mat aunts    Social History Social History   Tobacco Use  . Smoking status: Never Smoker  . Smokeless tobacco: Never Used  Substance Use Topics  . Alcohol use: No  . Drug use: No     Allergies   Patient has no known allergies.   Review of Systems Review of Systems  All other systems reviewed and are negative.    Physical Exam Updated Vital Signs BP 111/65 (BP Location: Left Arm)   Pulse 73   Temp 98 F (36.7 C) (Oral)  Resp 18   LMP 02/18/2018 (Approximate)   SpO2 100%   Physical Exam Vitals signs and nursing note reviewed.  Constitutional:      Appearance: She is well-developed.  HENT:     Head: Normocephalic and atraumatic.  Eyes:     Conjunctiva/sclera: Conjunctivae normal.     Pupils: Pupils are equal, round, and reactive to light.  Neck:     Musculoskeletal: Normal range of motion and neck supple.  Cardiovascular:     Rate and Rhythm: Normal rate and regular rhythm.     Heart sounds: No murmur. No friction rub. No gallop.   Pulmonary:     Effort: Pulmonary effort is normal. No respiratory distress.     Breath sounds: Normal breath sounds. No wheezing or rales.  Chest:     Chest wall: No tenderness.  Abdominal:     General: Bowel sounds are normal. There is no  distension.     Palpations: Abdomen is soft. There is no mass.     Tenderness: There is no abdominal tenderness. There is no guarding or rebound.  Genitourinary:    Comments: Chaperone present for exam, mild amount of dark blood in vagina, no active bleeding Musculoskeletal: Normal range of motion.        General: No tenderness.  Skin:    General: Skin is warm and dry.  Neurological:     Mental Status: She is alert and oriented to person, place, and time.  Psychiatric:        Behavior: Behavior normal.        Thought Content: Thought content normal.        Judgment: Judgment normal.      ED Treatments / Results  Labs (all labs ordered are listed, but only abnormal results are displayed) Labs Reviewed  URINALYSIS, ROUTINE W REFLEX MICROSCOPIC - Abnormal; Notable for the following components:      Result Value   Hgb urine dipstick MODERATE (*)    All other components within normal limits  URINE CULTURE  WET PREP, GENITAL  PREGNANCY, URINE  CBC  GC/CHLAMYDIA PROBE AMP ([place]) NOT AT [facility]    EKG None  Radiology No results found.  Procedures Procedures (including critical care time)  Medications Ordered in ED Medications - No data to display   Initial Impression / Assessment and Plan / ED Course  I have reviewed the triage vital signs and the nursing notes.  Pertinent labs & imaging results that were available during my care of the patient were reviewed by me and considered in my medical decision making (see chart for details).     Patient complaining of blood in urine.  Unclear whether the bleeding is from urinary source, vagina, or rectum, will check pelvic exam.  Patient states that she has some lower abdominal cramps.  Her last menstrual cycle finished 2 weeks ago.  Pregnancy test negative.  Pelvic exam shows mild amount of blood in vagina, but no active bleeding or hemorrhage.  We will discharged home with OB/GYN follow-up.  Final Clinical  Impressions(s) / ED Diagnoses   Final diagnoses:  Abnormal uterine bleeding    ED Discharge Orders    None       Montine Circle, PA-C 03/07/18 0234    Montine Circle, PA-C 03/07/18 0234    Little, Wenda Overland, MD 03/07/18 [phone]

## 2018-03-08 LAB — URINE CULTURE

## 2018-03-08 LAB — GC/CHLAMYDIA PROBE AMP (~~LOC~~) NOT AT ARMC
Chlamydia: NEGATIVE
Neisseria Gonorrhea: NEGATIVE

## 2018-08-26 ENCOUNTER — Other Ambulatory Visit: Payer: Self-pay

## 2018-08-26 DIAGNOSIS — Z20822 Contact with and (suspected) exposure to covid-19: Secondary | ICD-10-CM

## 2018-08-28 LAB — NOVEL CORONAVIRUS, NAA: SARS-CoV-2, NAA: NOT DETECTED

## 2018-11-08 IMAGING — CR DG CHEST 2V
2 series · 2 of 2 positions shown · non-contrast
Comparison: 08/27/2015 chest x-ray.

CLINICAL DATA: 41-year-old female with palpitations for the past
month. Nonsmoker. Initial encounter.

EXAM:
CHEST  2 VIEW

[chest pa]
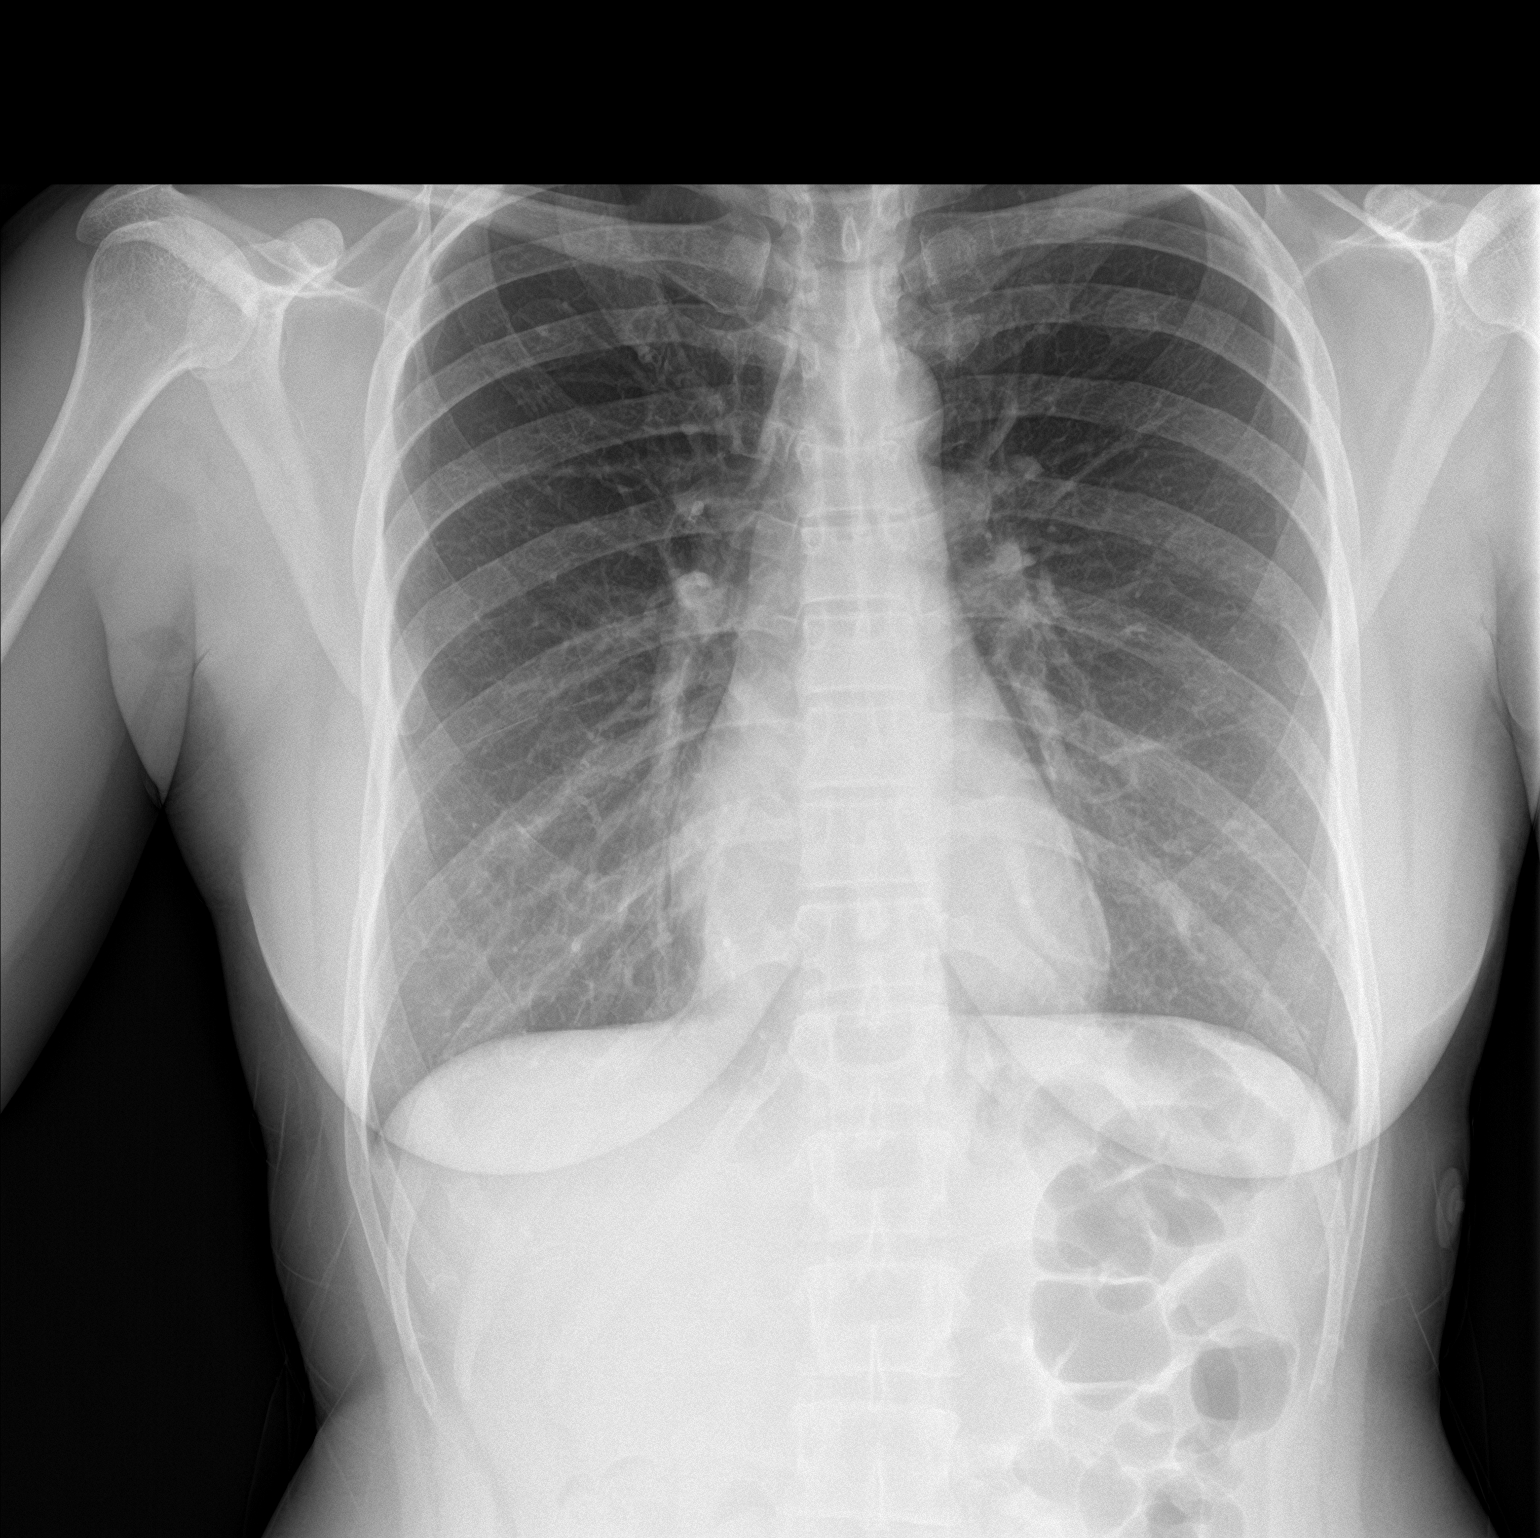

[chest lat]
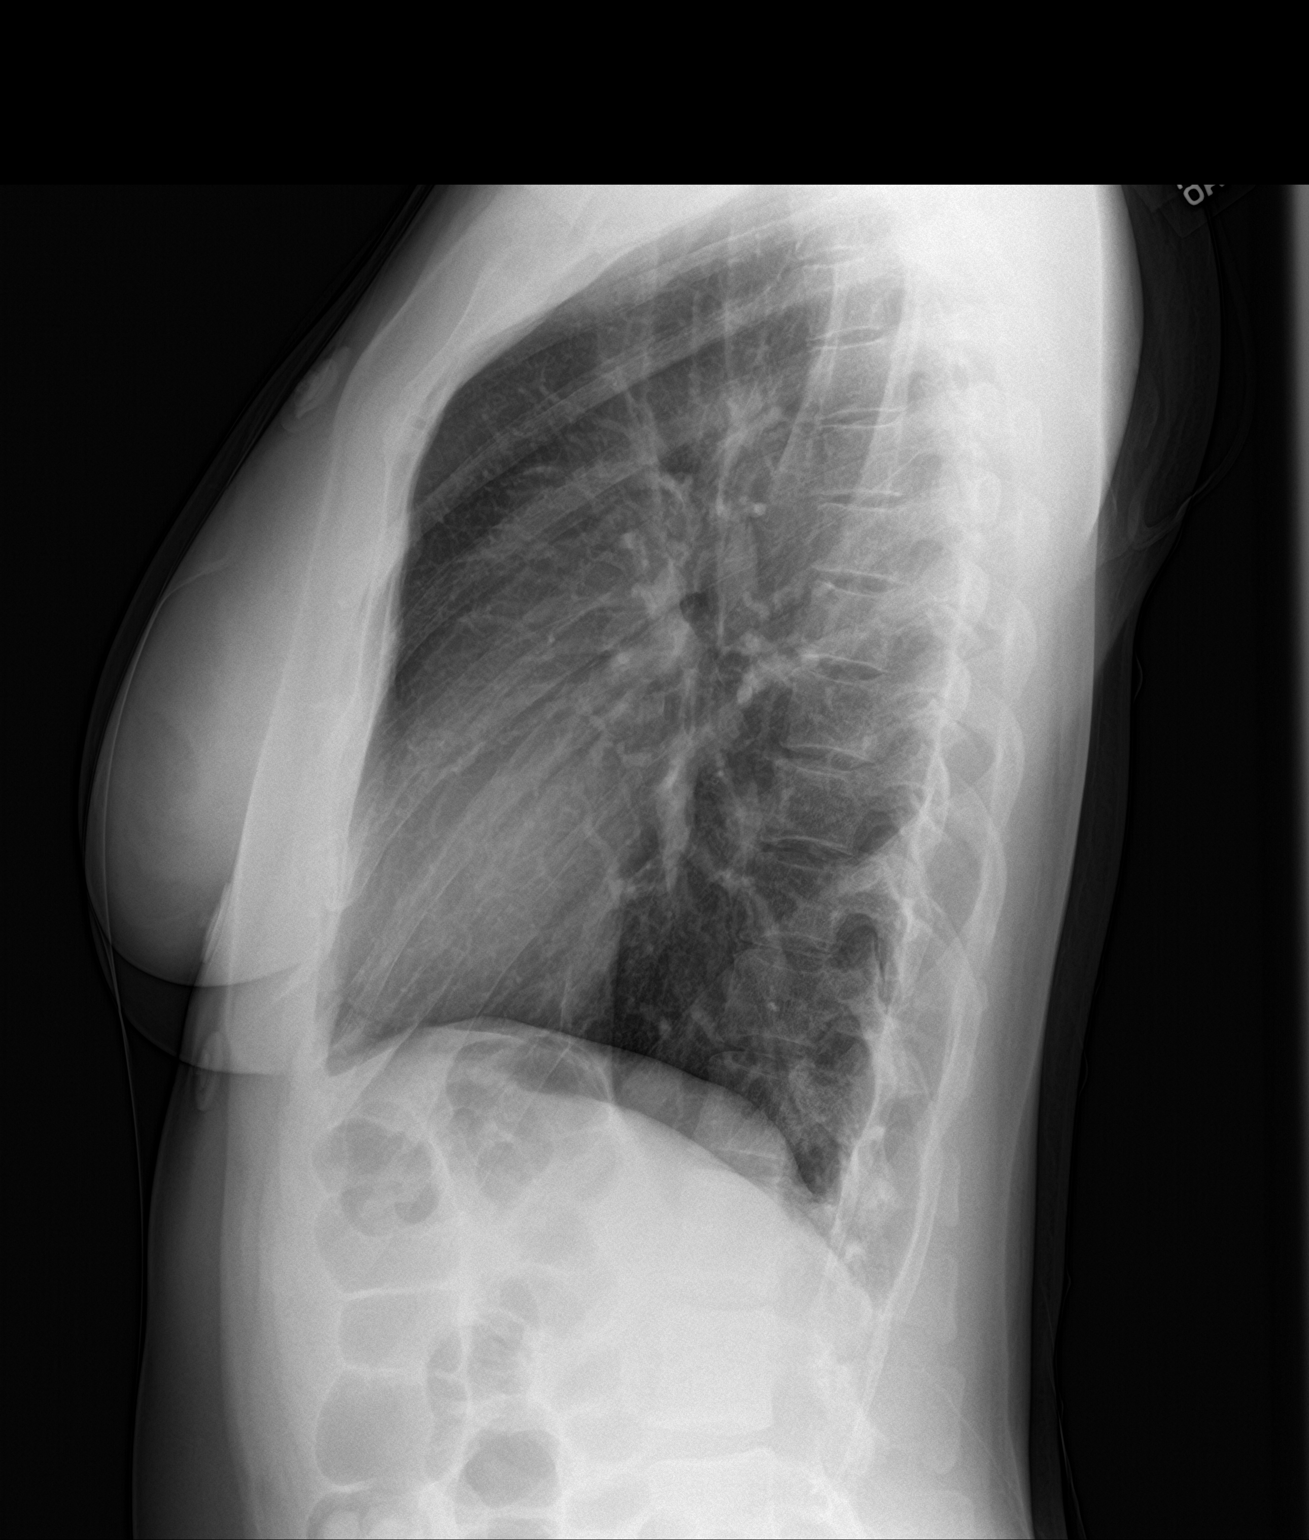

[2 of 2 positions shown; findings below may reference images not displayed]

FINDINGS: No infiltrate, congestive heart failure or pneumothorax.

Minimal pleural thickening right lung apex unchanged.

No plain film evidence of pulmonary malignancy.

Heart size within normal limits.

No acute osseous abnormality.
IMPRESSION: No active cardiopulmonary disease.

## 2019-03-10 ENCOUNTER — Other Ambulatory Visit: Payer: Self-pay | Admitting: Obstetrics and Gynecology

## 2019-03-10 DIAGNOSIS — E049 Nontoxic goiter, unspecified: Secondary | ICD-10-CM

## 2019-03-14 ENCOUNTER — Other Ambulatory Visit: Payer: Self-pay | Admitting: Obstetrics and Gynecology

## 2019-03-14 DIAGNOSIS — R1032 Left lower quadrant pain: Secondary | ICD-10-CM

## 2019-03-16 ENCOUNTER — Other Ambulatory Visit: Payer: Self-pay

## 2019-03-16 ENCOUNTER — Ambulatory Visit
Admission: RE | Admit: 2019-03-16 | Discharge: 2019-03-16 | Disposition: A | Discharge status: Home / Self Care | Payer: BC Managed Care – PPO | Source: Ambulatory Visit | Attending: Obstetrics and Gynecology | Admitting: Obstetrics and Gynecology

## 2019-03-16 DIAGNOSIS — E049 Nontoxic goiter, unspecified: Secondary | ICD-10-CM

## 2019-03-16 DIAGNOSIS — R1032 Left lower quadrant pain: Secondary | ICD-10-CM

## 2020-01-09 IMAGING — MG DIGITAL DIAGNOSTIC BILATERAL MAMMOGRAM WITH TOMO AND CAD
6 of 10 series · 6 of 30 positions shown · non-contrast
Comparison: Previous exam(s).

CLINICAL DATA: Patient presents for palpable abnormality within the
medial left breast.

EXAM:
DIGITAL DIAGNOSTIC BILATERAL MAMMOGRAM WITH CAD AND TOMO
ULTRASOUND LEFT BREAST

[R MLO synth-2D]
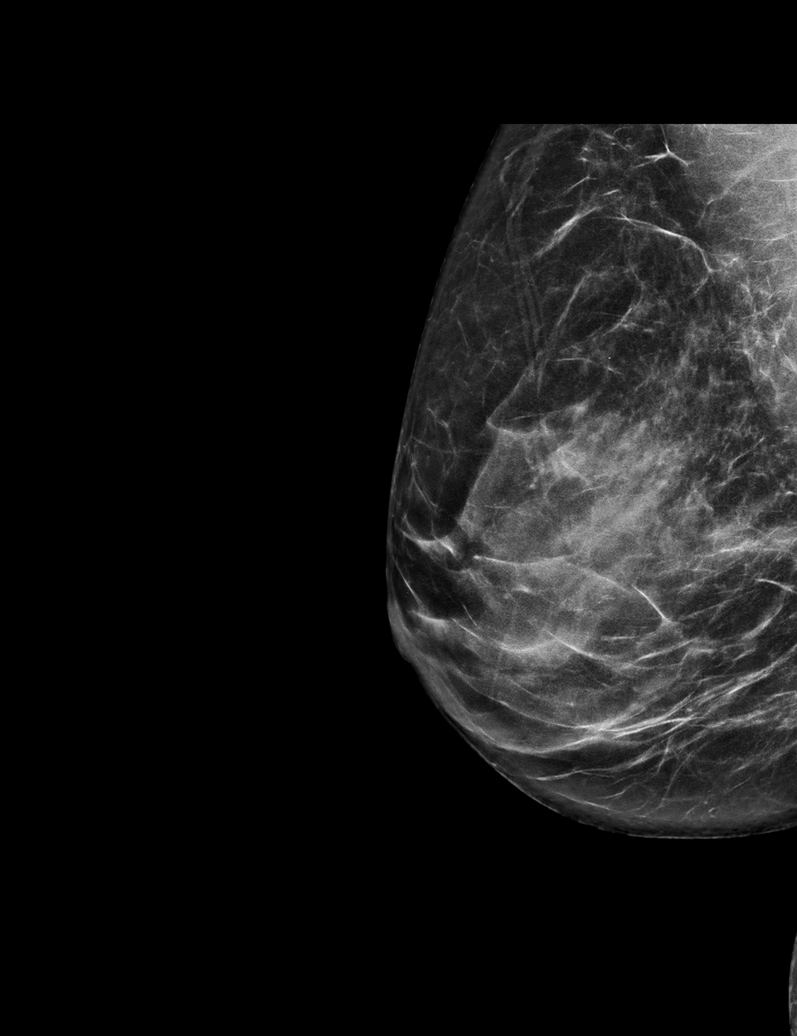

[R CC synth-2D]
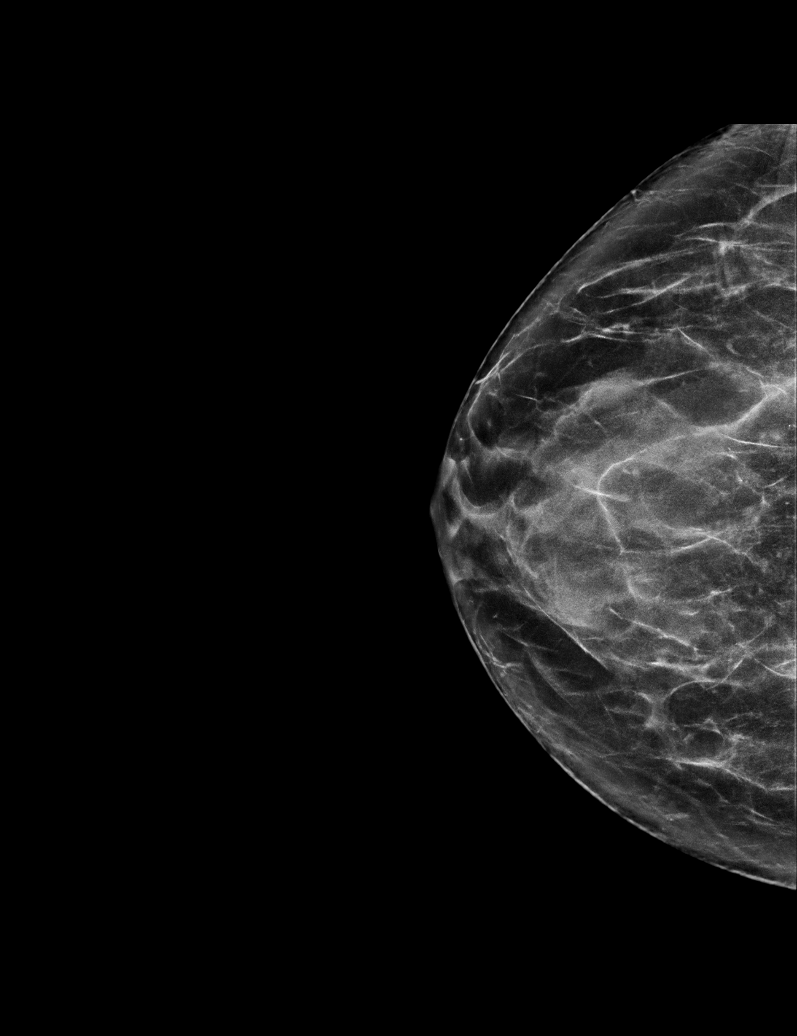

[L CC synth-2D]
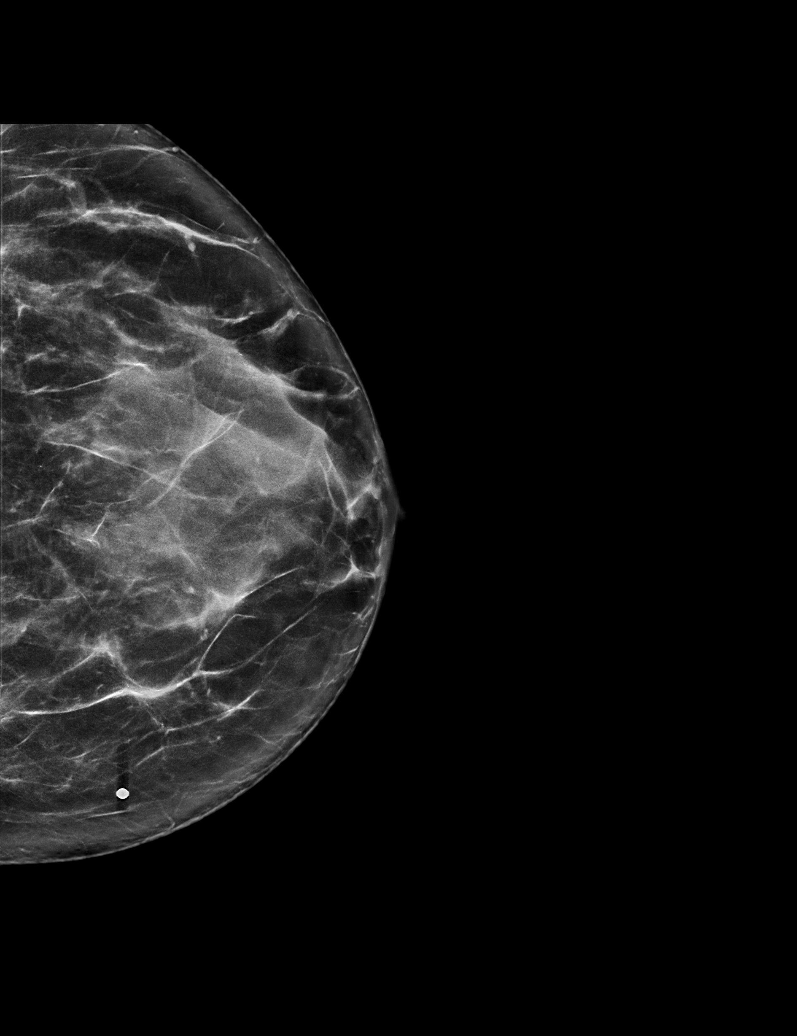

[L TAN synth-2D]
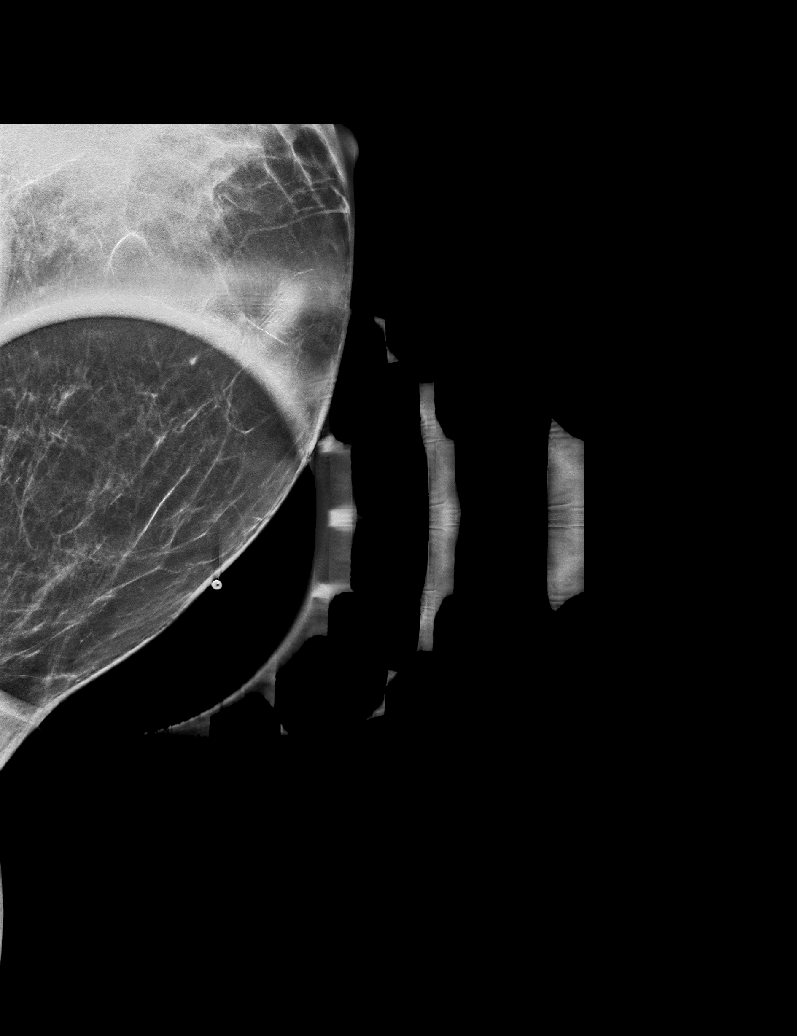

[L MLO synth-2D]
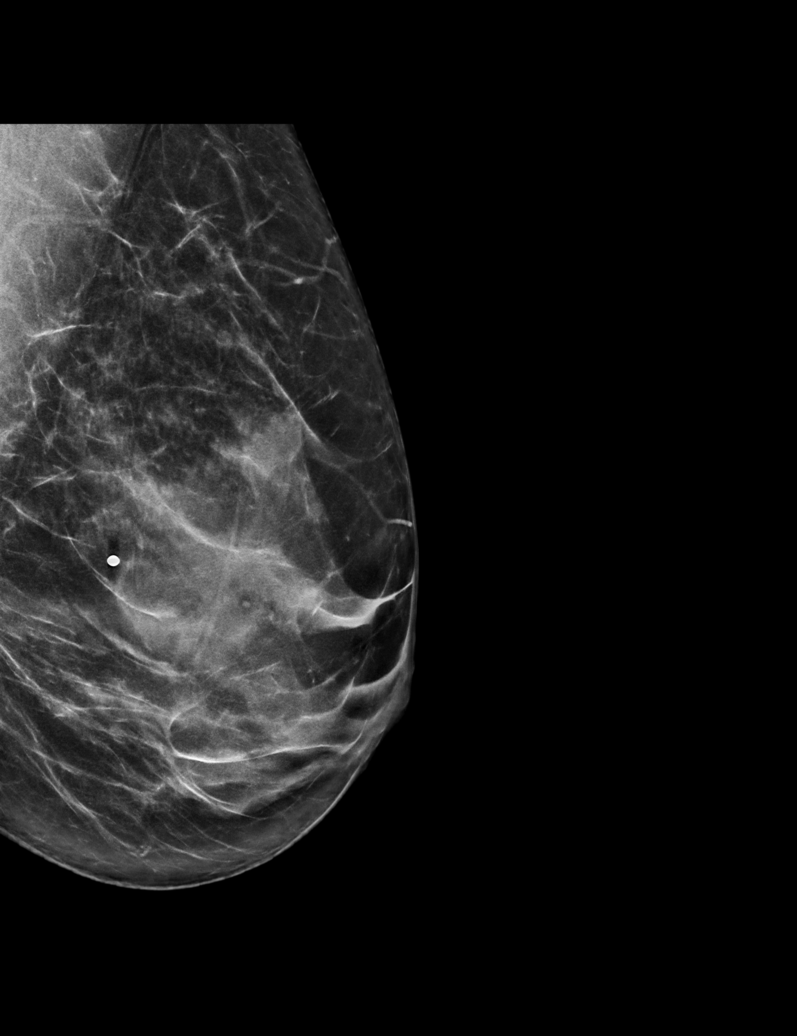

[L MLO tomo · tomo slice 35/70.0]
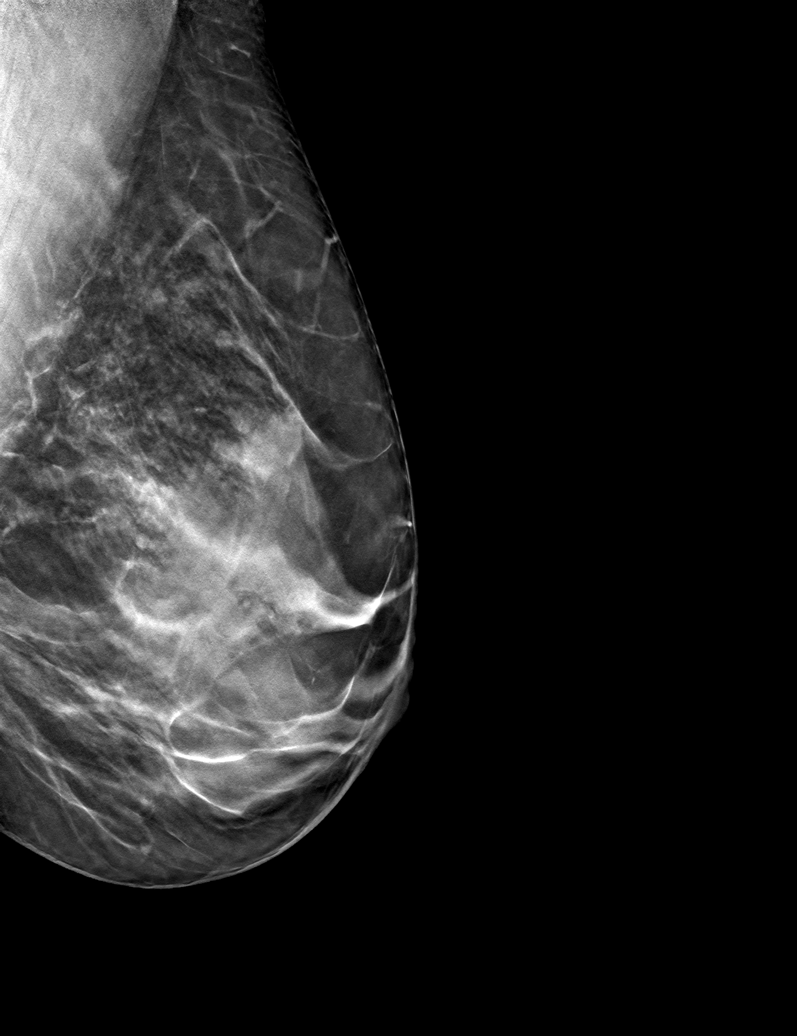

[6 of 30 positions shown; findings below may reference images not displayed]

ACR Breast Density Category c: The breast tissue is heterogeneously
dense, which may obscure small masses.
FINDINGS: No concerning masses, calcifications or distortion identified within
either breast.

Mammographic images were processed with CAD.

On physical exam, I palpate a small masslike density within the
medial left breast.

Targeted ultrasound is performed, showing normal dense tissue
underlying the site of palpable concern within the left breast 9-10
o'clock position 5 cm from nipple. No suspicious abnormality
identified.
IMPRESSION: No mammographic evidence for malignancy.

RECOMMENDATION:
Continued clinical evaluation for left breast palpable abnormality.

Screening mammogram in one year.(Code:L6-F-ML7)

I have discussed the findings and recommendations with the patient.
Results were also provided in writing at the conclusion of the
visit. If applicable, a reminder letter will be sent to the patient
regarding the next appointment.

BI-RADS CATEGORY  2: Benign.

## 2021-03-18 ENCOUNTER — Other Ambulatory Visit: Payer: Self-pay | Admitting: Obstetrics and Gynecology

## 2021-03-18 DIAGNOSIS — E049 Nontoxic goiter, unspecified: Secondary | ICD-10-CM

## 2021-03-29 ENCOUNTER — Other Ambulatory Visit: Payer: BC Managed Care – PPO

## 2021-04-04 ENCOUNTER — Other Ambulatory Visit: Payer: Self-pay

## 2022-03-07 NOTE — Progress Notes (Incomplete)
Cardiology Office Note:    Date:  03/07/2022   ID:  Cheryl Rice, DOB 1975/05/06, MRN [id number]  PCP:  Archie Patten, MD   [facility] Providers Cardiologist:  None { Click to update primary MD,subspecialty MD or APP then REFRESH:1}    Referring MD: Archie Patten, MD   No chief complaint on file.   History of Present Illness:    Cheryl Rice is a 47 y.o. female with a hx of *** here for  Today,  *** denies any palpitations, chest pain, shortness of breath, or peripheral edema. No lightheadedness, headaches, syncope, orthopnea, or PND.  (+)  Past Medical History:  Diagnosis Date   Fibroid uterus    Sickle cell trait ([place])    Syncope and collapse 11/05/2012    Past Surgical History:  Procedure Laterality Date   WISDOM TOOTH EXTRACTION      Current Medications: No outpatient medications have been marked as taking for the 03/10/22 encounter (Appointment) with Skeet Latch, MD.     Allergies:   Patient has no known allergies.   Social History   Socioeconomic History   Marital status: Married    Spouse name: Not on file   Number of children: 1   Years of education: college   Highest education level: Not on file  Occupational History    Employer: [employer]  Tobacco Use   Smoking status: Never   Smokeless tobacco: Never  Substance and Sexual Activity   Alcohol use: No   Drug use: No   Sexual activity: Not on file  Other Topics Concern   Not on file  Social History Narrative   Not on file   Social Determinants of Health   Financial Resource Strain: Not on file  Food Insecurity: Not on file  Transportation Needs: Not on file  Physical Activity: Not on file  Stress: Not on file  Social Connections: Not on file     Family History: The patient's family history includes Alcohol abuse in her father; Diabetes in her maternal aunt, maternal grandmother, mother, and paternal grandmother; Glaucoma in her father; Hypertension  in her father and mother; Prostate cancer in her father.  ROS:   Please see the history of present illness.     All other systems reviewed and are negative.  EKGs/Labs/Other Studies Reviewed:    The following studies were reviewed today:  ***  EKG:    : Sinus ***. Rate *** bpm.  Recent Labs: No results found for requested labs within last 365 days.   Recent Lipid Panel No results found for: "CHOL", "TRIG", "HDL", "CHOLHDL", "VLDL", "LDLCALC", "LDLDIRECT"   Risk Assessment/Calculations:   {Does this patient have ATRIAL FIBRILLATION?:[phone]}       Physical Exam:    Wt Readings from Last 3 Encounters:  08/27/15 148 lb (67.1 kg)  06/09/13 146 lb (66.2 kg)  11/05/12 142 lb (64.4 kg)     VS:  There were no vitals taken for this visit. , BMI There is no height or weight on file to calculate BMI. GENERAL:  Well appearing HEENT: Pupils equal round and reactive, fundi not visualized, oral mucosa unremarkable NECK:  No jugular venous distention, waveform within normal limits, carotid upstroke brisk and symmetric, no bruits, no thyromegaly LYMPHATICS:  No cervical adenopathy LUNGS:  Clear to auscultation bilaterally HEART:  RRR.  PMI not displaced or sustained,S1 and S2 within normal limits, no S3, no S4, no clicks, no rubs, *** murmurs ABD:  Flat, positive  bowel sounds normal in frequency in pitch, no bruits, no rebound, no guarding, no midline pulsatile mass, no hepatomegaly, no splenomegaly EXT:  2 plus pulses throughout, no edema, no cyanosis no clubbing SKIN:  No rashes no nodules NEURO:  Cranial nerves II through XII grossly intact, motor grossly intact throughout PSYCH:  Cognitively intact, oriented to person place and time   ASSESSMENT:    No diagnosis found. PLAN:    No problem-specific Assessment & Plan notes found for this encounter.    {Are you ordering a CV Procedure (e.g. stress test, cath, DCCV, TEE, etc)?   Press F2        :[id number]    Disposition: FU with Tiffany C. Oval Linsey, MD, [facility] in ***  Medication Adjustments/Labs and Tests Ordered: Current medicines are reviewed at length with the patient today.  Concerns regarding medicines are outlined above.   No orders of the defined types were placed in this encounter.  No orders of the defined types were placed in this encounter.  There are no Patient Instructions on file for this visit.   I,Mathew Stumpf,acting as a Education administrator for Skeet Latch, MD.,have documented all relevant documentation on the behalf of Skeet Latch, MD,as directed by  Skeet Latch, MD while in the presence of Skeet Latch, MD.  ***  Signed, Madelin Rear  03/07/2022 12:30 PM    [name]

## 2022-03-10 ENCOUNTER — Ambulatory Visit (HOSPITAL_BASED_OUTPATIENT_CLINIC_OR_DEPARTMENT_OTHER): Payer: Self-pay | Admitting: Cardiovascular Disease

## 2022-06-03 ENCOUNTER — Telehealth (HOSPITAL_BASED_OUTPATIENT_CLINIC_OR_DEPARTMENT_OTHER): Payer: Self-pay

## 2022-06-03 NOTE — Telephone Encounter (Signed)
LMTCB concerning scheduled appointment on 5/16 with Dr. Duke Salvia. Looks like pt has already seen Cardiology at [facility]. Want to make sure she still needs appointment with Dr. Duke Salvia.

## 2022-06-12 ENCOUNTER — Ambulatory Visit (HOSPITAL_BASED_OUTPATIENT_CLINIC_OR_DEPARTMENT_OTHER): Payer: Self-pay | Admitting: Cardiovascular Disease

## 2022-07-09 ENCOUNTER — Ambulatory Visit: Payer: Managed Care, Other (non HMO) | Admitting: Dermatology

## 2022-07-09 VITALS — BP 109/71

## 2022-07-09 DIAGNOSIS — L659 Nonscarring hair loss, unspecified: Secondary | ICD-10-CM

## 2022-07-09 MED ORDER — CLOBETASOL PROPIONATE 0.05 % EX SOLN: CUTANEOUS | 2 refills | Status: AC

## 2022-07-09 NOTE — Progress Notes (Signed)
   New Patient Visit   Subjective  Cheryl Rice is a 47 y.o. female who presents for the following: hair loss. Patient noticed in February at base of neck up to around ears, greater on right side. No recent surgeries or illnesses. Patient's PCP recommended collagen, patient started orally about 3 weeks ago, may have a little more "fuzz". She has not used any OTC products for hair loss. No itching.   The following portions of the chart were reviewed this encounter and updated as appropriate: medications, allergies, medical history  Review of Systems:  No other skin or systemic complaints except as noted in HPI or Assessment and Plan.  Objective  Well appearing patient in no apparent distress; mood and affect are within normal limits.   A focused examination was performed of the following areas: scalp  Relevant exam findings are noted in the Assessment and Plan.    Assessment & Plan    Alopecia Scalp  Recommend minoxidil 5% (Rogaine for men) solution or foam to be applied to the scalp and left in. This should ideally be used once daily in the morning for best results but it helps with hair regrowth when used at least three times per week. Rogaine initially can cause increased hair shedding for the first few weeks but this will stop with continued use. In studies, people who used minoxidil (Rogaine) for at least 6 months had thicker hair than people who did not. Minoxidil topical (Rogaine) only works as long as it continues to be used. If if it is no longer used then the hair it has been helping to regrow can fall out. Minoxidil topical (Rogaine) can cause increased facial hair growth which can usually be managed easily with a battery-operated hair trimmer. If facial hair growth is bothersome, switching to the 2% women's version can decrease the risk of unwanted facial hair growth.  Continue collagen supplement. Recommend Vital Protein powder. Or may continue oral supplement she is  taking now, possibly Viviscal with collagen.   Start clobetasol 0.05% solution daily to affected areas at scalp as needed.    No follow-ups on file.  Anise Salvo, RMA, am acting as scribe for [employer], DO .   Documentation: I have reviewed the above documentation for accuracy and completeness, and I agree with the above.  Langston Reusing, DO

## 2022-07-09 NOTE — Patient Instructions (Addendum)
Recommend minoxidil 5% (Rogaine for men) solution or foam to be applied to the scalp and left in. This should ideally be used once daily in the morning for best results but it helps with hair regrowth when used at least three times per week. Rogaine initially can cause increased hair shedding for the first few weeks but this will stop with continued use. In studies, people who used minoxidil (Rogaine) for at least 6 months had thicker hair than people who did not. Minoxidil topical (Rogaine) only works as long as it continues to be used. If if it is no longer used then the hair it has been helping to regrow can fall out. Minoxidil topical (Rogaine) can cause increased facial hair growth which can usually be managed easily with a battery-operated hair trimmer. If facial hair growth is bothersome, switching to the 2% women's version can decrease the risk of unwanted facial hair growth.  Continue collagen supplement. Recommend Vital Protein powder. Or may continue oral supplement she is taking now, possibly Viviscal with collagen.   Start clobetasol 0.05% solution once nightly to affected areas at scalp as needed. Avoid applying to face, groin, and axilla. Use as directed. Long-term use can cause thinning of the skin.  Topical steroids (such as triamcinolone, fluocinolone, fluocinonide, mometasone, clobetasol, halobetasol, betamethasone, hydrocortisone) can cause thinning and lightening of the skin if they are used for too long in the same area. Your physician has selected the right strength medicine for your problem and area affected on the body. Please use your medication only as directed by your physician to prevent side effects.   Due to recent changes in healthcare laws, you may see results of your pathology and/or laboratory studies on MyChart before the doctors have had a chance to review them. We understand that in some cases there may be results that are confusing or concerning to you. Please  understand that not all results are received at the same time and often the doctors may need to interpret multiple results in order to provide you with the best plan of care or course of treatment. Therefore, we ask that you please give [place] 2 business days to thoroughly review all your results before contacting the office for clarification. Should we see a critical lab result, you will be contacted sooner.   If You Need Anything After Your Visit  If you have any questions or concerns for your doctor, please call our main line at [phone] If no one answers, please leave a voicemail as directed and we will return your call as soon as possible. Messages left after 4 pm will be answered the following business day.   You may also send [place] a message via MyChart. We typically respond to MyChart messages within 1-2 business days.  For prescription refills, please ask your pharmacy to contact our office. Our fax number is [phone].  If you have an urgent issue when the clinic is closed that cannot wait until the next business day, you can page your doctor at the number below.    Please note that while we do our best to be available for urgent issues outside of office hours, we are not available 24/7.   If you have an urgent issue and are unable to reach [place], you may choose to seek medical care at your doctor's office, retail clinic, urgent care center, or emergency room.  If you have a medical emergency, please immediately call 911 or go to the emergency department. In the event  of inclement weather, please call our main line at [phone] for an update on the status of any delays or closures.  Dermatology Medication Tips: Please keep the boxes that topical medications come in in order to help keep track of the instructions about where and how to use these. Pharmacies typically print the medication instructions only on the boxes and not directly on the medication tubes.   If your medication is  too expensive, please contact our office at [phone] or send [place] a message through MyChart.   We are unable to tell what your co-pay for medications will be in advance as this is different depending on your insurance coverage. However, we may be able to find a substitute medication at lower cost or fill out paperwork to get insurance to cover a needed medication.   If a prior authorization is required to get your medication covered by your insurance company, please allow [place] 1-2 business days to complete this process.  Drug prices often vary depending on where the prescription is filled and some pharmacies may offer cheaper prices.  The website www.goodrx.com contains coupons for medications through different pharmacies. The prices here do not account for what the cost may be with help from insurance (it may be cheaper with your insurance), but the website can give you the price if you did not use any insurance.  - You can print the associated coupon and take it with your prescription to the pharmacy.  - You may also stop by our office during regular business hours and pick up a GoodRx coupon card.  - If you need your prescription sent electronically to a different pharmacy, notify our office through [facility] or by phone at [phone]

## 2022-10-13 ENCOUNTER — Ambulatory Visit: Payer: Managed Care, Other (non HMO) | Admitting: Dermatology

## 2022-11-25 ENCOUNTER — Ambulatory Visit: Payer: Managed Care, Other (non HMO) | Admitting: Dermatology

## 2023-04-23 ENCOUNTER — Other Ambulatory Visit (HOSPITAL_COMMUNITY): Payer: Self-pay

## 2023-04-23 ENCOUNTER — Encounter (HOSPITAL_BASED_OUTPATIENT_CLINIC_OR_DEPARTMENT_OTHER): Payer: Self-pay | Admitting: Cardiovascular Disease

## 2023-04-23 ENCOUNTER — Ambulatory Visit (HOSPITAL_BASED_OUTPATIENT_CLINIC_OR_DEPARTMENT_OTHER): Payer: Managed Care, Other (non HMO) | Admitting: Cardiovascular Disease

## 2023-04-23 VITALS — BP 110/80 | HR 82 | Ht 66.0 in | Wt 175.0 lb

## 2023-04-23 DIAGNOSIS — R55 Syncope and collapse: Secondary | ICD-10-CM

## 2023-04-23 DIAGNOSIS — F419 Anxiety disorder, unspecified: Secondary | ICD-10-CM | POA: Diagnosis not present

## 2023-04-23 DIAGNOSIS — Z01812 Encounter for preprocedural laboratory examination: Secondary | ICD-10-CM | POA: Diagnosis not present

## 2023-04-23 DIAGNOSIS — R002 Palpitations: Secondary | ICD-10-CM | POA: Diagnosis not present

## 2023-04-23 MED ORDER — IVABRADINE HCL 5 MG PO TABS
10.0000 mg | ORAL_TABLET | Freq: Once | ORAL | 0 refills | Status: AC
  Filled 2023-04-23: qty 2, 1d supply, fill #0

## 2023-04-23 NOTE — Patient Instructions (Addendum)
 Medication Instructions:  TAKE IVABRADINE 10 MG 2 HOURS  PRIOR TO CT SCAN   *If you need a refill on your cardiac medications before your next appointment, please call your pharmacy*  Lab Work: BMET 1 WEEK PRIOR TO CT SCAN  If you have labs (blood work) drawn today and your tests are completely normal, you will receive your results only by: MyChart Message (if you have MyChart) OR A paper copy in the mail If you have any lab test that is abnormal or we need to change your treatment, we will call you to review the results.  Testing/Procedures: Your physician has requested that you have cardiac CT. Cardiac computed tomography (CT) is a painless test that uses an x-ray machine to take clear, detailed pictures of your heart. For further information please visit [email or web address] Please follow instruction sheet as given.   Follow-Up: At [facility], you and your health needs are our priority.  As part of our continuing mission to provide you with exceptional heart care, our providers are all part of one team.  This team includes your primary Cardiologist (physician) and Advanced Practice Providers or APPs (Physician Assistants and Nurse Practitioners) who all work together to provide you with the care you need, when you need it.  Your next appointment:   2 month(s)  Provider:   Chilton Si, MD, Eligha Bridegroom, NP, or Gillian Shields, NP     We recommend signing up for the patient portal called "MyChart".  Sign up information is provided on this After Visit Summary.  MyChart is used to connect with patients for Virtual Visits (Telemedicine).  Patients are able to view lab/test results, encounter notes, upcoming appointments, etc.  Non-urgent messages can be sent to your provider as well.   To learn more about what you can do with MyChart, go to [email or web address].   Other Instructions    Your cardiac CT will be scheduled at one of the below locations:   [facility] [address] [place], [place] [address] [phone]  OR  [facility] [address] [place], [place] [address] [phone]  OR   [facility] [address] [place], [place] [address] [phone]  OR   [facility] [address] [place], [place] [address] [phone]  If scheduled at [facility], please arrive at the [facility] and Children's Entrance (Entrance C2) of [facility] 30 minutes prior to test start time. You can use the FREE valet parking offered at entrance C (encouraged to control the heart rate for the test)  Proceed to the [facility] Radiology Department (first floor) to check-in and test prep.  All radiology patients and guests should use entrance C2 at [facility], accessed from [facility], even though the hospital's physical address listed is [address]    If scheduled at [facility] or [facility], please arrive 15 mins early for check-in and test prep.  There is spacious parking and easy access to the radiology department from the [facility] V[facility] entrance. Please enter here and check-in with the desk attendant.   If scheduled at [facility], please arrive 30 minutes early for check-in and test prep.  Please follow these instructions carefully (unless otherwise directed):  An IV will be required for this test and Nitroglycerin will be given.  Hold all erectile dysfunction medications at least 3 days (  72 hrs) prior to test. (Ie viagra, cialis, sildenafil, tadalafil, etc)   On the Night Before the Test: Be sure to Drink plenty of water. Do not consume any caffeinated/decaffeinated beverages or chocolate 12 hours prior to your test. Do not take any antihistamines 12 hours prior to your test. If the patient has contrast  allergy: Patient will need a prescription for Prednisone and very clear instructions (as follows): Prednisone 50 mg - take 13 hours prior to test Take another Prednisone 50 mg 7 hours prior to test Take another Prednisone 50 mg 1 hour prior to test Take Benadryl 50 mg 1 hour prior to test Patient must complete all four doses of above prophylactic medications. Patient will need a ride after test due to Benadryl.  On the Day of the Test: Drink plenty of water until 1 hour prior to the test. Do not eat any food 1 hour prior to test. You may take your regular medications prior to the test.  Take metoprolol (Lopressor) two hours prior to test. If you take Furosemide/Hydrochlorothiazide/Spironolactone/Chlorthalidone, please HOLD on the morning of the test. Patients who wear a continuous glucose monitor MUST remove the device prior to scanning. FEMALES- please wear underwire-free bra if available, avoid dresses & tight clothing      After the Test: Drink plenty of water. After receiving IV contrast, you may experience a mild flushed feeling. This is normal. On occasion, you may experience a mild rash up to 24 hours after the test. This is not dangerous. If this occurs, you can take Benadryl 25 mg, Zyrtec, Claritin, or Allegra and increase your fluid intake. (Patients taking Tikosyn should avoid Benadryl, and may take Zyrtec, Claritin, or Allegra) If you experience trouble breathing, this can be serious. If it is severe call 911 IMMEDIATELY. If it is mild, please call our office.  We will call to schedule your test 2-4 weeks out understanding that some insurance companies will need an authorization prior to the service being performed.   For more information and frequently asked questions, please visit our website : [email or web address]  For non-scheduling related questions, please contact the cardiac imaging nurse navigator should you have any questions/concerns: Cardiac Imaging  Nurse Navigators Direct Office Dial: [phone]   For scheduling needs, including cancellations and rescheduling, please call [place], [phone].  Cardiac CT Angiogram A cardiac CT angiogram is a procedure to look at the heart and the area around the heart. It may be done to help find the cause of chest pains or other symptoms of heart disease. During this procedure, a substance called contrast dye is injected into a vein in the arm. The contrast highlights the blood vessels in the area to be checked. A large X-ray machine (CT scanner), then takes detailed pictures of the heart and the surrounding area. The procedure is also sometimes called a coronary CT angiogram, coronary artery scanning, or CTA. A cardiac CT angiogram allows the health care provider to see how well blood is flowing to and from the heart. The provider will be able to see if there are any problems, such as: Blockage or narrowing of the arteries in the heart. Fluid around the heart. Signs of weakness or disease in the muscles, valves, and tissues of the heart. Tell a health care provider about: Any allergies you have. This is especially important if you have had a previous allergic reaction to medicines, contrast dye, or iodine. All medicines you are taking, including vitamins, herbs, eye drops, creams, and over-the-counter medicines. Any  bleeding problems you have. Any surgeries you have had. Any medical conditions you have, including kidney problems or kidney failure. Whether you are pregnant or may be pregnant. Any anxiety disorders, chronic pain, or other conditions you have. These may increase your stress or prevent you from lying still. Any history of abnormal heart rhythms or heart procedures. What are the risks? Your provider will talk with you about risks. These may include: Bleeding. Infection. Allergic reactions to medicines or dyes. Damage to other structures or organs. Kidney damage from the contrast  dye. Increased risk of cancer from radiation exposure. This risk is low. Talk with your provider about: The risks and benefits of testing. How you can receive the lowest dose of radiation. What happens before the procedure? Wear comfortable clothing and remove any jewelry, glasses, dentures, and hearing aids. Follow instructions from your provider about eating and drinking. These may include: 12 hours before the procedure Avoid caffeine. This includes tea, coffee, soda, energy drinks, and diet pills. Drink plenty of water or other fluids that do not have caffeine in them. Being well hydrated can prevent complications. 4-6 hours before the procedure Stop eating and drinking. This will reduce the risk of nausea from the contrast dye. Ask your provider about changing or stopping your regular medicines. These include: Diabetes medicines. Medicines to treat problems with erections (erectile dysfunction). If you have kidney problems, you may need to receive IV hydration before and after the test. What happens during the procedure?  Hair on your chest may need to be removed so that small sticky patches called electrodes can be placed on your chest. These will transmit information that helps to monitor your heart during the procedure. An IV will be inserted into one of your veins. You might be given a medicine to control your heart rate during the procedure. This will help to ensure that good images are obtained. You will be asked to lie on an exam table. This table will slide in and out of the CT machine during the procedure. Contrast dye will be injected into the IV. You might feel warm, or you may get a metallic taste in your mouth. You may be given medicines to relax or dilate the arteries in your heart. If you are allergic to contrast dyes or iodine you may be given medicine before the test to reduce the risk of an allergic reaction. The table that you are lying on will move into the CT machine  tunnel for the scan. The person running the machine will give you instructions while the scans are being done. You may be asked to: Keep your arms above your head. Hold your breath for short periods. Stay very still, even if the table is moving. The procedure may vary among providers and hospitals. What can I expect after the procedure? After your procedure, it is common to have: A metallic taste in your mouth from the contrast dye. A feeling of warmth. A headache from the heart medicine. Follow these instructions at home: Take over-the-counter and prescription medicines only as told by your provider. If you are told, drink enough fluid to keep your pee pale yellow. This will help to flush the contrast dye out of your body. Most people can return to their normal activities right after the procedure. Ask your provider what activities are safe for you. It is up to you to get the results of your procedure. Ask your provider, or the department that is doing the procedure, when your results  will be ready. Contact a health care provider if: You have any symptoms of allergy to the contrast dye. These include: Shortness of breath. Rash or hives. A racing heartbeat. You notice a change in your peeing (urination). This information is not intended to replace advice given to you by your health care provider. Make sure you discuss any questions you have with your health care provider. Document Revised: 08/16/2021 Document Reviewed: 08/16/2021 Elsevier Patient Education  2024 [employer].

## 2023-04-23 NOTE — Progress Notes (Signed)
 Cardiology Office Note:  .   Date:  04/23/2023  ID:  Cheryl Rice, DOB [birth year], MRN [id number] PCP: Joanna Puff, MD  [facility] Providers Cardiologist:  None    History of Present Illness: .   Cheryl Rice is a 48 y.o. female who is being seen today for the evaluation of palpitations at the request of Joanna Puff, MD.  Discussed the use of AI scribe software for clinical note transcription with the patient, who gave verbal consent to proceed.  History of Present Illness Cheryl Rice is a 48 year old female who presents with palpitations occurring primarily upon waking.  She experiences palpitations primarily upon waking, described as a 'boom' sensation, occurring every time she wakes up, whether by alarm or naturally. These episodes last about five minutes and subside as she begins to move. Similar symptoms in the past were attributed to anxiety. She has previously used a heart rate monitoring app, noting heart rates between 80 and 100 bpm, but has not used it recently. No lightheadedness, dizziness, chest pain, or pressure during these episodes. Occasional ankle swelling resolves overnight, attributed to prolonged sitting and soda consumption. Current medications include sertraline, taken for about a year and a half, and previously tried metoprolol, discontinued due to side effects. Rates anxiety control as 4 out of 10, noting sertraline wears off by midday.  She experiences shortness of breath when walking up stairs or during extended periods of walking, attributing it to being out of shape. No chest pain or pressure during physical activity.  Anxiety is managed with sertraline 25 mg, which she feels wears off by midday. She rates her anxiety control as 4 out of 10.  Her family history includes hypertension in both parents and atrial fibrillation in her father. Her paternal grandmother died of a massive stroke.  She denies any personal history of  smoking or alcohol use, but consumes caffeine daily. Experiences occasional acid reflux, triggered by certain foods or eating late.   ROS:  As per HPI  Studies Reviewed: Marland Kitchen   EKG Interpretation Date/Time:  Thursday April 23 2023 16:11:28 EDT Ventricular Rate:  82 PR Interval:  134 QRS Duration:  90 QT Interval:  358 QTC Calculation: 418 R Axis:   23  Text Interpretation: Normal sinus rhythm RSR' or QR pattern in V1 suggests right ventricular conduction delay No significant change since last tracing Confirmed by Chilton Si ([id number]) on 04/23/2023 4:22:18 PM    Risk Assessment/Calculations:             Physical Exam:   VS:  BP 110/80   Pulse 82   Ht 5\' 6"  (1.676 m)   Wt 175 lb (79.4 kg)   SpO2 97%   BMI 28.25 kg/m  , BMI Body mass index is 28.25 kg/m. GENERAL:  Well appearing HEENT: Pupils equal round and reactive, fundi not visualized, oral mucosa unremarkable NECK:  No jugular venous distention, waveform within normal limits, carotid upstroke brisk and symmetric, no bruits, no thyromegaly LUNGS:  Clear to auscultation bilaterally HEART:  RRR.  PMI not displaced or sustained,S1 and S2 within normal limits, no S3, no S4, no clicks, no rubs, no murmurs ABD:  Flat, positive bowel sounds normal in frequency in pitch, no bruits, no rebound, no guarding, no midline pulsatile mass, no hepatomegaly, no splenomegaly EXT:  2 plus pulses throughout, no edema, no cyanosis no clubbing SKIN:  No rashes no nodules NEURO:  Cranial nerves II through XII grossly intact,  motor grossly intact throughout PSYCH:  Cognitively intact, oriented to person place and time   ASSESSMENT AND PLAN: .    Assessment & Plan Palpitations Palpitations likely due to stress or anxiety, with cortisol levels peaking in the morning. Cardiac pathology not suspected, but cardiac CT preferred to rule out coronary artery disease. - Order cardiac CT to assess for coronary artery disease. - Administer metoprolol  prior to cardiac CT to slow heart rate. - Discuss with primary care physician about increasing sertraline dose. - Encourage therapy for anxiety management. - Consider black cohosh for potential perimenopausal symptoms.  Shortness of breath on exertion Shortness of breath likely due to deconditioning. Cardiac pathology not suspected, but cardiac CT will rule out coronary artery disease. - Order cardiac CT to assess for coronary artery disease.  Anxiety Anxiety managed with sertraline 25 mg, but control rated as 4/10. Increasing dose may improve symptoms. - Discuss with primary care physician about increasing sertraline dose. - Encourage therapy for anxiety management.  Acid reflux Occasional acid reflux not primary cause of palpitations but may contribute if occurring at night.  Family history of cardiovascular disease Family history of hypertension and atrial fibrillation. Cardiac CT will assess for coronary artery disease. - Order cardiac CT to assess for coronary artery disease.         Dispo: f/u in 2 months  Signed, Chilton Si, MD

## 2023-04-24 ENCOUNTER — Other Ambulatory Visit: Payer: Self-pay

## 2023-04-27 ENCOUNTER — Encounter (HOSPITAL_BASED_OUTPATIENT_CLINIC_OR_DEPARTMENT_OTHER): Payer: Self-pay | Admitting: Cardiovascular Disease

## 2023-04-27 DIAGNOSIS — F419 Anxiety disorder, unspecified: Secondary | ICD-10-CM | POA: Insufficient documentation

## 2023-04-27 DIAGNOSIS — R002 Palpitations: Secondary | ICD-10-CM

## 2023-04-27 HISTORY — DX: Anxiety disorder, unspecified: F41.9

## 2023-04-27 HISTORY — DX: Palpitations: R00.2

## 2023-05-04 ENCOUNTER — Other Ambulatory Visit (HOSPITAL_COMMUNITY): Payer: Self-pay

## 2023-05-15 ENCOUNTER — Encounter (HOSPITAL_COMMUNITY): Payer: Self-pay

## 2023-05-18 ENCOUNTER — Telehealth (HOSPITAL_COMMUNITY): Payer: Self-pay | Admitting: *Deleted

## 2023-05-18 NOTE — Telephone Encounter (Signed)
 Attempted to call patient regarding upcoming cardiac CT appointment. Left message on voicemail with name and callback number  Larey Brick RN Navigator Cardiac Imaging [facility] [phone] Office [phone] Cell

## 2023-05-19 ENCOUNTER — Ambulatory Visit (HOSPITAL_COMMUNITY): Admission: RE | Admit: 2023-05-19 | Source: Ambulatory Visit

## 2023-06-05 ENCOUNTER — Telehealth (HOSPITAL_COMMUNITY): Payer: Self-pay | Admitting: Emergency Medicine

## 2023-06-05 ENCOUNTER — Telehealth: Payer: Self-pay | Admitting: Pharmacy Technician

## 2023-06-05 DIAGNOSIS — R079 Chest pain, unspecified: Secondary | ICD-10-CM

## 2023-06-05 MED ORDER — IVABRADINE HCL 5 MG PO TABS
10.0000 mg | ORAL_TABLET | Freq: Once | ORAL | 0 refills | Status: AC
Start: 2023-06-05 — End: 2023-06-05

## 2023-06-05 NOTE — Telephone Encounter (Signed)
 Received request for IVABRADINE  to do a prior authorization request but the patients insurance denied stating no prior auth for 2 tablets, only prior auth would be able to be done for a daily prescription. I called [facility] and they are now filling this prescription for 20.00

## 2023-06-05 NOTE — Telephone Encounter (Signed)
 Reaching out to patient to offer assistance regarding upcoming cardiac imaging study; pt verbalizes understanding of appt date/time, parking situation and where to check in, pre-test NPO status and medications ordered, and verified current allergies; name and call back number provided for further questions should they arise Rockwell Alexandria RN Navigator Cardiac Imaging [facility] [phone] office [phone] cell

## 2023-06-08 ENCOUNTER — Ambulatory Visit (HOSPITAL_COMMUNITY)
Admission: RE | Admit: 2023-06-08 | Discharge: 2023-06-08 | Disposition: A | Discharge status: Home / Self Care | Source: Ambulatory Visit | Attending: Cardiovascular Disease | Admitting: Cardiovascular Disease

## 2023-06-08 MED ORDER — METOPROLOL TARTRATE 5 MG/5ML IV SOLN: 10.0000 mg | Freq: Once | INTRAVENOUS | Status: DC | PRN

## 2023-06-08 MED ORDER — DILTIAZEM HCL 25 MG/5ML IV SOLN: 10.0000 mg | INTRAVENOUS | Status: DC | PRN

## 2023-06-08 MED ORDER — NITROGLYCERIN 0.4 MG SL SUBL: 0.8000 mg | SUBLINGUAL_TABLET | Freq: Once | SUBLINGUAL | Status: DC

## 2023-06-25 ENCOUNTER — Other Ambulatory Visit: Payer: Self-pay

## 2023-06-25 ENCOUNTER — Ambulatory Visit (HOSPITAL_COMMUNITY)
Admission: EM | Admit: 2023-06-25 | Discharge: 2023-06-25 | Disposition: A | Discharge status: Home / Self Care | Attending: Internal Medicine | Admitting: Internal Medicine

## 2023-06-25 ENCOUNTER — Encounter (HOSPITAL_COMMUNITY): Payer: Self-pay

## 2023-06-25 ENCOUNTER — Encounter (HOSPITAL_BASED_OUTPATIENT_CLINIC_OR_DEPARTMENT_OTHER): Payer: Self-pay

## 2023-06-25 ENCOUNTER — Emergency Department (HOSPITAL_BASED_OUTPATIENT_CLINIC_OR_DEPARTMENT_OTHER)
Admission: EM | Admit: 2023-06-25 | Discharge: 2023-06-25 | Disposition: A | Discharge status: Home / Self Care | Attending: Emergency Medicine | Admitting: Emergency Medicine

## 2023-06-25 DIAGNOSIS — R1032 Left lower quadrant pain: Secondary | ICD-10-CM | POA: Diagnosis not present

## 2023-06-25 DIAGNOSIS — K529 Noninfective gastroenteritis and colitis, unspecified: Secondary | ICD-10-CM | POA: Diagnosis not present

## 2023-06-25 DIAGNOSIS — R197 Diarrhea, unspecified: Secondary | ICD-10-CM | POA: Diagnosis not present

## 2023-06-25 DIAGNOSIS — K625 Hemorrhage of anus and rectum: Secondary | ICD-10-CM

## 2023-06-25 DIAGNOSIS — R109 Unspecified abdominal pain: Secondary | ICD-10-CM | POA: Diagnosis present

## 2023-06-25 LAB — CBC WITH DIFFERENTIAL/PLATELET
Abs Immature Granulocytes: 0.03 10*3/uL (ref 0.00–0.07)
Basophils Absolute: 0.1 10*3/uL (ref 0.0–0.1)
Basophils Relative: 1 %
Eosinophils Absolute: 0.1 10*3/uL (ref 0.0–0.5)
Eosinophils Relative: 1 %
HCT: 36.8 % (ref 36.0–46.0)
Hemoglobin: 12.6 g/dL (ref 12.0–15.0)
Immature Granulocytes: 0 %
Lymphocytes Relative: 19 %
Lymphs Abs: 1.4 10*3/uL (ref 0.7–4.0)
MCH: 28.6 pg (ref 26.0–34.0)
MCHC: 34.2 g/dL (ref 30.0–36.0)
MCV: 83.4 fL (ref 80.0–100.0)
Monocytes Absolute: 0.5 10*3/uL (ref 0.1–1.0)
Monocytes Relative: 7 %
Neutro Abs: 5.4 10*3/uL (ref 1.7–7.7)
Neutrophils Relative %: 72 %
Platelets: 212 10*3/uL (ref 150–400)
RBC: 4.41 MIL/uL (ref 3.87–5.11)
RDW: 12.8 % (ref 11.5–15.5)
WBC: 7.6 10*3/uL (ref 4.0–10.5)
nRBC: 0 % (ref 0.0–0.2)

## 2023-06-25 LAB — POCT URINALYSIS DIP (MANUAL ENTRY)
Bilirubin, UA: NEGATIVE
Blood, UA: NEGATIVE
Glucose, UA: NEGATIVE mg/dL
Ketones, POC UA: NEGATIVE mg/dL
Leukocytes, UA: NEGATIVE
Nitrite, UA: NEGATIVE
Protein Ur, POC: NEGATIVE mg/dL
Spec Grav, UA: <=1.005 — AB (ref 1.010–1.025)
Urobilinogen, UA: 0.2 U/dL
pH, UA: 6.5 (ref 5.0–8.0)

## 2023-06-25 LAB — COMPREHENSIVE METABOLIC PANEL WITH GFR
ALT: 18 U/L (ref 0–44)
AST: 23 U/L (ref 15–41)
Albumin: 4.3 g/dL (ref 3.5–5.0)
Alkaline Phosphatase: 77 U/L (ref 38–126)
Anion gap: 13 (ref 5–15)
BUN: 8 mg/dL (ref 6–20)
CO2: 23 mmol/L (ref 22–32)
Calcium: 10.2 mg/dL (ref 8.9–10.3)
Chloride: 101 mmol/L (ref 98–111)
Creatinine, Ser: 0.92 mg/dL (ref 0.44–1.00)
GFR, Estimated: >60 mL/min (ref >60)
Glucose, Bld: 87 mg/dL (ref 70–99)
Potassium: 3.3 mmol/L — ABNORMAL LOW (ref 3.5–5.1)
Sodium: 137 mmol/L (ref 135–145)
Total Bilirubin: 0.7 mg/dL (ref 0.0–1.2)
Total Protein: 7.4 g/dL (ref 6.5–8.1)

## 2023-06-25 MED ORDER — AMOXICILLIN-POT CLAVULANATE 875-125 MG PO TABS
1.0000 | ORAL_TABLET | Freq: Once | ORAL | Status: AC
  Administered 2023-06-25: 1 via ORAL
  Filled 2023-06-25: qty 1

## 2023-06-25 MED ORDER — AMOXICILLIN-POT CLAVULANATE 875-125 MG PO TABS: 1.0000 | ORAL_TABLET | Freq: Two times a day (BID) | ORAL | 0 refills | Status: AC

## 2023-06-25 NOTE — ED Provider Notes (Signed)
 [facility]    CSN: [id number] Arrival date & time: 06/25/23  1635      History   Chief Complaint Chief Complaint  Patient presents with   Abdominal Pain    HPI Cheryl Rice is a 48 y.o. female.   48 year old female presents urgent care with complaints of cramping abdominal pain, diarrhea and bright red blood per rectum.  She reports that this started last night with cramping abdominal pain which finally resulted in a very loose bowel movement.  She noticed bright red blood in the toilet and when she wiped.  This is happened twice now.  She continues to have cramping today as well as the loose stools.  Her last bowel movement was a very small amount about an hour and a half ago but it was loose with some blood.  She has not had any dark tarry stools.  She had a little bit of nausea last night but not today.  She has not eaten anything today because she was worried it would make things worse.  She has not had any abdominal surgeries.  She denies dysuria, hematuria (but is unsure because she has had blood from her rectum), fevers, chills, recent illness or sick contacts.  She has not had any new food in her diet.  The last thing she ate last night was ice cream which she normally has.  She does have a history of having to strain to use the bathroom and her stools are usually hard but no history of hemorrhoids.  She had a colonoscopy in 2022 which was normal.  She has never had blood per rectum before.  She is very concerned about this symptom and did schedule an appointment to see her primary care doctor tomorrow but due to the continued bleeding she was concerned she may have internal bleeding and wanted to be evaluated.     Abdominal Pain Associated symptoms: diarrhea and nausea (last night not today)   Associated symptoms: no chest pain, no chills, no cough, no dysuria, no fever, no hematuria, no shortness of breath, no sore throat, no vaginal bleeding and no vomiting      Past Medical History:  Diagnosis Date   Anxiety 04/27/2023   Fibroid uterus    Palpitations 04/27/2023   Sickle cell trait (HCC)    Syncope and collapse 11/05/2012    Patient Active Problem List   Diagnosis Date Noted   Anxiety 04/27/2023   Palpitations 04/27/2023   Syncope and collapse 11/05/2012    Past Surgical History:  Procedure Laterality Date   WISDOM TOOTH EXTRACTION      OB History   No obstetric history on file.      Home Medications    Prior to Admission medications   Medication Sig Start Date End Date Taking? Authorizing Provider  clobetasol  (TEMOVATE ) 0.05 % external solution Apply to scalp nightly as needed. Avoid applying to face, groin, and axilla. Use as directed. Long-term use can cause thinning of the skin. 07/09/22   Dellar Fenton, DO  sertraline (ZOLOFT) 25 MG tablet Take 25 mg by mouth daily.    [provider]    Family History Family History  Problem Relation Age of Onset   Diabetes Mother    Hypertension Mother    Prostate cancer Father    Glaucoma Father    Hypertension Father    Alcohol abuse Father        as well as tobacco   Atrial fibrillation Father  Diabetes Maternal Aunt        several mat aunts   Diabetes Maternal Grandmother    Diabetes Paternal Grandmother        on dialysis   Stroke Paternal Grandmother     Social History Social History   Tobacco Use   Smoking status: Never   Smokeless tobacco: Never  Substance Use Topics   Alcohol use: No   Drug use: No     Allergies   Patient has no known allergies.   Review of Systems Review of Systems  Constitutional:  Positive for appetite change (afraid to eat due to cramping.). Negative for chills and fever.  HENT:  Negative for ear pain and sore throat.   Eyes:  Negative for pain and visual disturbance.  Respiratory:  Negative for cough and shortness of breath.   Cardiovascular:  Negative for chest pain and palpitations.  Gastrointestinal:   Positive for abdominal pain, blood in stool, diarrhea and nausea (last night not today). Negative for vomiting.  Genitourinary:  Negative for dysuria, frequency, hematuria, menstrual problem, urgency and vaginal bleeding.  Musculoskeletal:  Negative for arthralgias and back pain.  Skin:  Negative for color change and rash.  Neurological:  Negative for seizures and syncope.  All other systems reviewed and are negative.    Physical Exam Triage Vital Signs ED Triage Vitals [06/25/23 1714]  Encounter Vitals Group     BP 120/80     Systolic BP Percentile      Diastolic BP Percentile      Pulse Rate 93     Resp 18     Temp 99.1 F (37.3 C)     Temp Source Oral     SpO2 96 %     Weight      Height      Head Circumference      Peak Flow      Pain Score 6     Pain Loc      Pain Education      Exclude from Growth Chart    No data found.  Updated Vital Signs BP 120/80 (BP Location: Right Arm)   Pulse 93   Temp 99.1 F (37.3 C) (Oral)   Resp 18   LMP 06/15/2023 (Exact Date)   SpO2 96%   Visual Acuity Right Eye Distance:   Left Eye Distance:   Bilateral Distance:    Right Eye Near:   Left Eye Near:    Bilateral Near:     Physical Exam Vitals and nursing note reviewed.  Constitutional:      General: She is not in acute distress.    Appearance: She is well-developed.  HENT:     Head: Normocephalic and atraumatic.  Eyes:     Conjunctiva/sclera: Conjunctivae normal.  Cardiovascular:     Rate and Rhythm: Normal rate and regular rhythm.     Heart sounds: No murmur heard. Pulmonary:     Effort: Pulmonary effort is normal. No respiratory distress.     Breath sounds: Normal breath sounds.  Abdominal:     General: Abdomen is flat. Bowel sounds are normal.     Palpations: Abdomen is soft. There is no hepatomegaly.     Tenderness: There is abdominal tenderness (able to palp deeply without issues on physical exam) in the left lower quadrant. There is no right CVA  tenderness, left CVA tenderness, guarding or rebound. Negative signs include Murphy's sign.     Hernia: There is no hernia in the umbilical  area or ventral area.  Musculoskeletal:        General: No swelling.     Cervical back: Neck supple.  Skin:    General: Skin is warm and dry.     Capillary Refill: Capillary refill takes less than 2 seconds.  Neurological:     Mental Status: She is alert.  Psychiatric:        Mood and Affect: Mood normal.      UC Treatments / Results  Labs (all labs ordered are listed, but only abnormal results are displayed) Labs Reviewed  POCT URINALYSIS DIP (MANUAL ENTRY) - Abnormal; Notable for the following components:      Result Value   Spec Grav, UA <=1.005 (*)    All other components within normal limits    EKG   Radiology No results found.  Procedures Procedures (including critical care time)  Medications Ordered in UC Medications - No data to display  Initial Impression / Assessment and Plan / UC Course  I have reviewed the triage vital signs and the nursing notes.  Pertinent labs & imaging results that were available during my care of the patient were reviewed by me and considered in my medical decision making (see chart for details).     Abdominal pain, left lower quadrant  Bright red blood per rectum  Diarrhea of presumed infectious origin   Extremely long discussion with the patient about the symptoms.  Her urinalysis was negative for infection.  Her abdominal exam is overall benign with some mild left lower quadrant abdominal pain but able to palpate relatively deeply without issues. Rectal exam does not show any external hemorrhoids and no active bleeding is seen.  Discussed with the patient that this does not rule out an internal hemorrhoid bleeding.  Other possibilities is diverticulitis, viral gastroenteritis, rectal fissure or other intestinal disorder but that definitive diagnosis would be difficult at urgent care.  Advised  that overall she has a reassuring finding but the patient still expresses a significant level of concern and we advised that for further evaluation it would be best at the emergency department where advanced imaging and lab work can be done.  Did offer to send off a CBC tonight for further reassurance but advised that this would not be back urgently and that may take several hours and this still would not completely rule out internal bleeding that the patient is concerned about.  The patient has stated that she would rather have an evaluation done tonight and will go to the emergency department.  Final Clinical Impressions(s) / UC Diagnoses   Final diagnoses:  Abdominal pain, left lower quadrant  Bright red blood per rectum  Diarrhea of presumed infectious origin     Discharge Instructions      Left lower quadrant abdominal pain, diarrhea and bright red blood per rectum.  Differential diagnosis includes internal hemorrhoids that are bleeding, viral gastroenteritis, diverticulitis or other intestinal infection.  You do have reassuring findings on physical exam and vital signs are within normal limits.  We do not see any blood or external hemorrhoids on rectal exam but this does not rule out internal hemorrhoids or other source of bleeding in the lower aspect of the intestine. Less likely is an upper GI bleed as the blood is bright red which suggest a source closer to the rectum.  We are unable to give a definitive diagnosis for the source of the bleeding and in order to get a better understanding of the source, more  advanced lab work and imaging may be needed.  This can be accomplished at an emergency department.  Recommend evaluation tonight at an emergency department due to the level of concern that you have.   ED Prescriptions   None    PDMP not reviewed this encounter.   Acie Acosta 06/25/23 1835

## 2023-06-25 NOTE — ED Triage Notes (Signed)
 Pt c/o lower abdominal cramping since 9pm last night after eating ice cream. States has had 2 BRB loose stools today.

## 2023-06-25 NOTE — Discharge Instructions (Addendum)
 Left lower quadrant abdominal pain, diarrhea and bright red blood per rectum.  Differential diagnosis includes internal hemorrhoids that are bleeding, viral gastroenteritis, diverticulitis or other intestinal infection.  You do have reassuring findings on physical exam and vital signs are within normal limits.  We do not see any blood or external hemorrhoids on rectal exam but this does not rule out internal hemorrhoids or other source of bleeding in the lower aspect of the intestine. Less likely is an upper GI bleed as the blood is bright red which suggest a source closer to the rectum.  We are unable to give a definitive diagnosis for the source of the bleeding and in order to get a better understanding of the source, more advanced lab work and imaging may be needed.  This can be accomplished at an emergency department.  Recommend evaluation tonight at an emergency department due to the level of concern that you have.

## 2023-06-25 NOTE — ED Triage Notes (Signed)
 Pt c/o abd cramping, blood in stool onset this AM. Pt advises abd cramping last night, "large amount" of diarrhea x 1 bowel movement, noticed blood this morning. Denies dizziness, tarry stools, blood thinners.

## 2023-06-25 NOTE — ED Provider Notes (Signed)
 [facility] AT [facility] Provider Note   CSN: [id number] Arrival date & time: 06/25/23  4098     History  Chief Complaint  Patient presents with   Hematochezia    Cheryl Rice is a 48 y.o. female.  Patient presents to the emergency department for evaluation of abdominal pain, cramping, diarrhea with blood in stool.  Patient reports that she ate ice cream last night and then started having abdominal cramping.  Cramping continued through the night and this morning she had a bloody bowel movement.  She has had 1 further bowel movement today that was stool mixed with bright red blood.  Patient continues to have some pain in the left lower quadrant, it is improved.       Home Medications Prior to Admission medications   Medication Sig Start Date End Date Taking? Authorizing Provider  amoxicillin -clavulanate (AUGMENTIN ) 875-125 MG tablet Take 1 tablet by mouth every 12 (twelve) hours. 06/25/23  Yes Shannell Mikkelsen, Marine Sia, MD  clobetasol  (TEMOVATE ) 0.05 % external solution Apply to scalp nightly as needed. Avoid applying to face, groin, and axilla. Use as directed. Long-term use can cause thinning of the skin. 07/09/22   Dellar Fenton, DO  sertraline (ZOLOFT) 25 MG tablet Take 25 mg by mouth daily.    [provider]      Allergies    Patient has no known allergies.    Review of Systems   Review of Systems  Physical Exam Updated Vital Signs BP 112/79 (BP Location: Left Arm)   Pulse 72   Temp 97.7 F (36.5 C)   Resp 19   LMP 06/15/2023 (Exact Date)   SpO2 100%  Physical Exam Vitals and nursing note reviewed.  Constitutional:      General: She is not in acute distress.    Appearance: She is well-developed.  HENT:     Head: Normocephalic and atraumatic.     Mouth/Throat:     Mouth: Mucous membranes are moist.  Eyes:     General: Vision grossly intact. Gaze aligned appropriately.     Extraocular Movements: Extraocular  movements intact.     Conjunctiva/sclera: Conjunctivae normal.  Cardiovascular:     Rate and Rhythm: Normal rate and regular rhythm.     Pulses: Normal pulses.     Heart sounds: Normal heart sounds, S1 normal and S2 normal. No murmur heard.    No friction rub. No gallop.  Pulmonary:     Effort: Pulmonary effort is normal. No respiratory distress.     Breath sounds: Normal breath sounds.  Abdominal:     General: Bowel sounds are normal.     Palpations: Abdomen is soft.     Tenderness: There is abdominal tenderness in the left lower quadrant. There is no guarding or rebound.     Hernia: No hernia is present.  Musculoskeletal:        General: No swelling.     Cervical back: Full passive range of motion without pain, normal range of motion and neck supple. No spinous process tenderness or muscular tenderness. Normal range of motion.     Right lower leg: No edema.     Left lower leg: No edema.  Skin:    General: Skin is warm and dry.     Capillary Refill: Capillary refill takes less than 2 seconds.     Findings: No ecchymosis, erythema, rash or wound.  Neurological:     General: No focal deficit present.  Mental Status: She is alert and oriented to person, place, and time.     GCS: GCS eye subscore is 4. GCS verbal subscore is 5. GCS motor subscore is 6.     Cranial Nerves: Cranial nerves 2-12 are intact.     Sensory: Sensation is intact.     Motor: Motor function is intact.     Coordination: Coordination is intact.  Psychiatric:        Attention and Perception: Attention normal.        Mood and Affect: Mood normal.        Speech: Speech normal.        Behavior: Behavior normal.     ED Results / Procedures / Treatments   Labs (all labs ordered are listed, but only abnormal results are displayed) Labs Reviewed  COMPREHENSIVE METABOLIC PANEL WITH GFR - Abnormal; Notable for the following components:      Result Value   Potassium 3.3 (*)    All other components within normal  limits  CBC WITH DIFFERENTIAL/PLATELET    EKG None  Radiology No results found.  Procedures Procedures    Medications Ordered in ED Medications  amoxicillin -clavulanate (AUGMENTIN ) 875-125 MG per tablet 1 tablet (has no administration in time range)    ED Course/ Medical Decision Making/ A&P                                 Medical Decision Making Amount and/or Complexity of Data Reviewed Labs: ordered.   Differential Diagnosis considered includes, but not limited to: Appendicitis; colitis; diverticulitis; bowel obstruction; cystitis; nephrolithiasis; pyelonephritis; ovarian cyst, ovarian torsion, PID, ectopic pregnancy.  Patient presents to the emergency department for evaluation of left lower quadrant abdominal pain, cramping, and bloody stools.  Patient reports that she is feeling somewhat improved but has not eaten anything through the day.  Examination reveals mild left lower quadrant tenderness.  Vital signs are unremarkable.  No hypotension, no tachycardia.  Blood work shows no anemia.  This is reassuring.  She does not have a leukocytosis.  Did review her colonoscopy from 2022 at [facility].  She had a clear colonoscopy, the entire visualized colon was normal.  No history of diverticulosis.  This is reassuring, no concern for colon cancer with the bleeding.  Discussed possible CT scan with patient.  I also discussed empiric treatment for possible colitis.  She does not want to proceed with scan at this time.  She does have follow-up with her doctor scheduled for tomorrow.  It is therefore reasonable to initiate Augmentin  for presumed colitis, Imodium as needed for diarrhea, follow-up as scheduled.        Final Clinical Impression(s) / ED Diagnoses Final diagnoses:  Colitis    Rx / DC Orders ED Discharge Orders          Ordered    amoxicillin -clavulanate (AUGMENTIN ) 875-125 MG tablet  Every 12 hours        06/25/23 2346              Ballard Bongo, MD 06/25/23 [phone]

## 2023-06-25 NOTE — ED Notes (Signed)
 Patient is being discharged from the Urgent Care and sent to the Emergency Department via POV . Per Paola Bohr, NP, patient is in need of higher level of care due to need of further evaluation. Patient is aware and verbalizes understanding of plan of care.  Vitals:   06/25/23 1714  BP: 120/80  Pulse: 93  Resp: 18  Temp: 99.1 F (37.3 C)  SpO2: 96%
